# Patient Record
Sex: Male | Born: 1980
Health system: Southern US, Community
[De-identification: ages and names within clinical notes are randomized; demographics above are authoritative.]

## PROBLEM LIST (undated history)

## (undated) DIAGNOSIS — K4090 Unilateral inguinal hernia, without obstruction or gangrene, not specified as recurrent: Secondary | ICD-10-CM

## (undated) DIAGNOSIS — K219 Gastro-esophageal reflux disease without esophagitis: Secondary | ICD-10-CM

## (undated) DIAGNOSIS — N200 Calculus of kidney: Secondary | ICD-10-CM

## (undated) HISTORY — DX: Unilateral inguinal hernia, without obstruction or gangrene, not specified as recurrent: K40.90

## (undated) HISTORY — PX: CHOLECYSTECTOMY: SHX55

## (undated) SURGERY — REPAIR, HERNIA, INGUINAL, BILATERAL, LAPAROSCOPIC
Anesthesia: General | Laterality: Bilateral

## (undated) SURGERY — Surgical Case
Anesthesia: *Unknown

---

## 2003-12-17 ENCOUNTER — Emergency Department (HOSPITAL_COMMUNITY): Admission: EM | Admit: 2003-12-17 | Discharge: 2003-12-17 | Payer: Self-pay | Admitting: Emergency Medicine

## 2005-08-02 ENCOUNTER — Ambulatory Visit (HOSPITAL_COMMUNITY): Admission: RE | Admit: 2005-08-02 | Discharge: 2005-08-02 | Payer: Self-pay | Admitting: Cardiovascular Disease

## 2005-08-09 ENCOUNTER — Ambulatory Visit: Payer: Self-pay | Admitting: Internal Medicine

## 2006-11-25 ENCOUNTER — Emergency Department: Payer: Self-pay | Admitting: Emergency Medicine

## 2007-11-14 ENCOUNTER — Ambulatory Visit: Payer: Self-pay | Admitting: General Practice

## 2008-11-23 ENCOUNTER — Emergency Department: Payer: Self-pay | Admitting: Emergency Medicine

## 2008-11-24 ENCOUNTER — Emergency Department: Payer: Self-pay | Admitting: Emergency Medicine

## 2009-01-17 ENCOUNTER — Ambulatory Visit: Payer: Self-pay | Admitting: Internal Medicine

## 2009-02-03 ENCOUNTER — Ambulatory Visit: Payer: Self-pay | Admitting: Internal Medicine

## 2009-02-18 ENCOUNTER — Ambulatory Visit: Payer: Self-pay | Admitting: Surgery

## 2009-02-25 ENCOUNTER — Ambulatory Visit: Payer: Self-pay | Admitting: Surgery

## 2010-03-21 ENCOUNTER — Encounter: Admission: RE | Admit: 2010-03-21 | Discharge: 2010-03-21 | Payer: Self-pay | Admitting: Occupational Medicine

## 2011-05-03 ENCOUNTER — Emergency Department (HOSPITAL_COMMUNITY)
Admission: EM | Admit: 2011-05-03 | Discharge: 2011-05-03 | Disposition: A | Discharge status: Home / Self Care | Payer: Worker's Compensation | Attending: Emergency Medicine | Admitting: Emergency Medicine

## 2011-05-03 DIAGNOSIS — X58XXXA Exposure to other specified factors, initial encounter: Secondary | ICD-10-CM | POA: Insufficient documentation

## 2011-05-03 DIAGNOSIS — Y990 Civilian activity done for income or pay: Secondary | ICD-10-CM | POA: Insufficient documentation

## 2011-05-03 DIAGNOSIS — Z7721 Contact with and (suspected) exposure to potentially hazardous body fluids: Secondary | ICD-10-CM | POA: Insufficient documentation

## 2011-05-28 IMAGING — CR DG CHEST 1V
1 series · 1 of 1 positions shown · non-contrast
Comparison: None.

CLINICAL DATA: Physical exam

CHEST - 1 VIEW

[view not recorded]
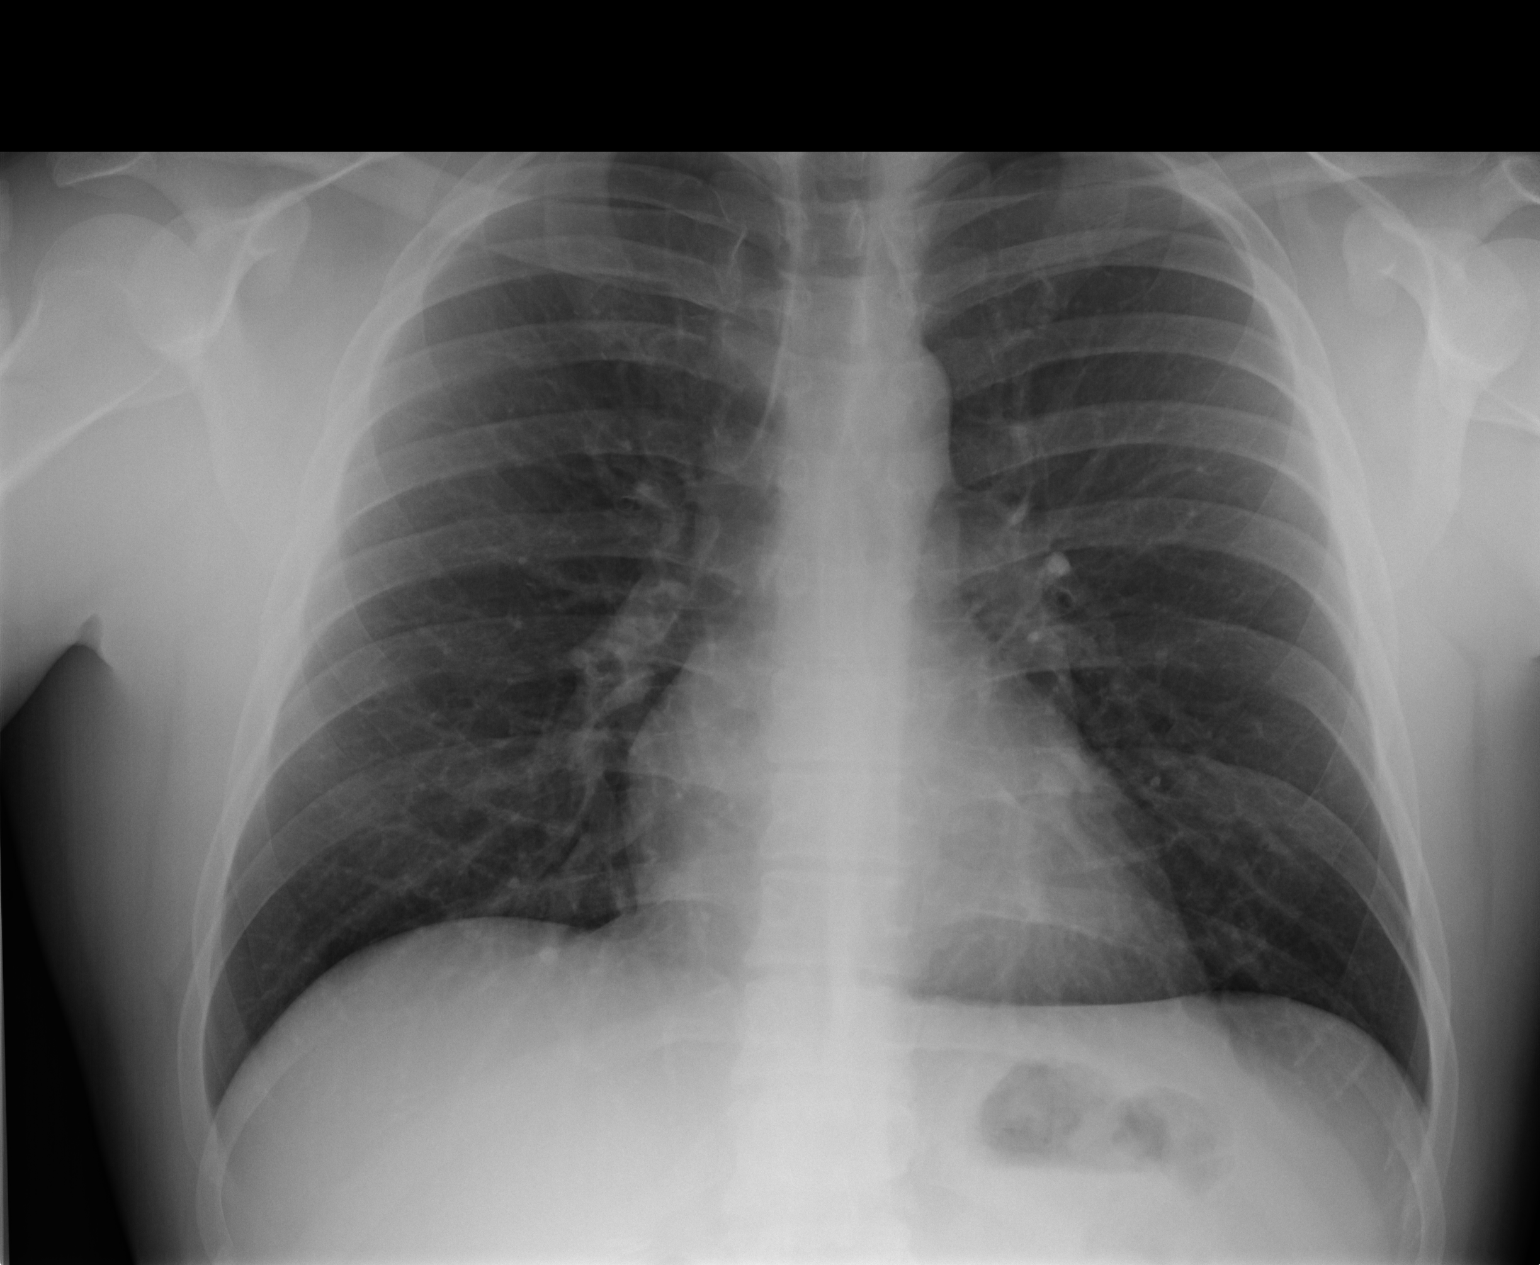

[1 of 1 positions shown; findings below may reference images not displayed]

FINDINGS: The lungs are clear.  Mediastinal contours appear normal.
The heart is within normal limits in size.  No bony abnormality is
seen.
IMPRESSION: No active lung disease.

## 2013-04-13 ENCOUNTER — Emergency Department (HOSPITAL_COMMUNITY)
Admission: EM | Admit: 2013-04-13 | Discharge: 2013-04-13 | Disposition: A | Discharge status: Home / Self Care | Payer: Worker's Compensation | Attending: Emergency Medicine | Admitting: Emergency Medicine

## 2013-04-13 ENCOUNTER — Encounter (HOSPITAL_COMMUNITY): Payer: Self-pay | Admitting: Emergency Medicine

## 2013-04-13 DIAGNOSIS — S50861A Insect bite (nonvenomous) of right forearm, initial encounter: Secondary | ICD-10-CM

## 2013-04-13 DIAGNOSIS — R51 Headache: Secondary | ICD-10-CM | POA: Insufficient documentation

## 2013-04-13 DIAGNOSIS — R209 Unspecified disturbances of skin sensation: Secondary | ICD-10-CM | POA: Insufficient documentation

## 2013-04-13 DIAGNOSIS — Y9289 Other specified places as the place of occurrence of the external cause: Secondary | ICD-10-CM | POA: Insufficient documentation

## 2013-04-13 DIAGNOSIS — Y9389 Activity, other specified: Secondary | ICD-10-CM | POA: Insufficient documentation

## 2013-04-13 DIAGNOSIS — Z23 Encounter for immunization: Secondary | ICD-10-CM | POA: Insufficient documentation

## 2013-04-13 DIAGNOSIS — R21 Rash and other nonspecific skin eruption: Secondary | ICD-10-CM | POA: Insufficient documentation

## 2013-04-13 DIAGNOSIS — IMO0001 Reserved for inherently not codable concepts without codable children: Secondary | ICD-10-CM | POA: Insufficient documentation

## 2013-04-13 MED ORDER — SULFAMETHOXAZOLE-TRIMETHOPRIM 800-160 MG PO TABS: 1.0000 | ORAL_TABLET | Freq: Two times a day (BID) | ORAL | Status: DC

## 2013-04-13 MED ORDER — TETANUS-DIPHTH-ACELL PERTUSSIS 5-2.5-18.5 LF-MCG/0.5 IM SUSP
0.5000 mL | Freq: Once | INTRAMUSCULAR | Status: AC
  Administered 2013-04-13: 0.5 mL via INTRAMUSCULAR
  Filled 2013-04-13: qty 0.5

## 2013-04-13 NOTE — ED Provider Notes (Signed)
History    This chart was scribed for Bob Liu, a non-physician practitioner working with No att. providers found by Lewanda Rife, ED Scribe. This patient was seen in room TR05C/TR05C and the patient's care was started at 2219.     CSN: [id number]  Arrival date & time 04/13/13  2050   First MD Initiated Contact with Patient 04/13/13 2132      Chief Complaint  Patient presents with  . Insect Bite    (Consider location/radiation/quality/duration/timing/severity/associated sxs/prior treatment) The history is provided by the patient.   HPI Comments: PASTOR SGRO is a 32 y.o. male who presents to the Emergency Department complaining of worsening unknown insect bite onset approximately 4 am this morning. Pt noticed increased erythema and swelling on the right lateral forearm from initial bite. States area is only painful w/ palpation and rate pain at worse 4/10. Pt also reports noticing an insect head at site of swelling. Pt states he was able to remove insect head, but unable to identify it. Pt has noticed minimal clear drainage from site. Has associated numbness in left pinky finger and some mild discomfort in left elbow. Patient is also complaining of slight generalized dull headache. Denies fevers, chills, nausea, vomiting, visual disturbance.    History reviewed. No pertinent past medical history.  History reviewed. No pertinent past surgical history.  No family history on file.  History  Substance Use Topics  . Smoking status: Never Smoker   . Smokeless tobacco: Not on file  . Alcohol Use: Yes      Review of Systems  Constitutional: Negative for fever and chills.  Gastrointestinal: Negative for nausea, vomiting and abdominal pain.  Musculoskeletal: Positive for arthralgias.  Skin: Positive for rash.  Neurological: Positive for numbness and headaches.  Psychiatric/Behavioral: Negative for confusion.    Allergies  Review of patient's allergies  indicates no known allergies.  Home Medications   Current Outpatient Rx  Name  Route  Sig  Dispense  Refill  . fexofenadine (ALLEGRA) 180 MG tablet   Oral   Take 180 mg by mouth daily.         Marland Kitchen sulfamethoxazole-trimethoprim (SEPTRA DS) 800-160 MG per tablet   Oral   Take 1 tablet by mouth every 12 (twelve) hours.   20 tablet   0     BP 144/86  Pulse 79  Temp(Src) 98.5 F (36.9 C) (Oral)  Resp 20  SpO2 97%  Physical Exam  Nursing note and vitals reviewed. Constitutional: He is oriented to person, place, and time. He appears well-developed and well-nourished. No distress.  HENT:  Head: Normocephalic and atraumatic.  Eyes: EOM are normal.  Neck: Neck supple. No tracheal deviation present.  Cardiovascular: Normal rate.   Pulmonary/Chest: Effort normal. No respiratory distress.  Musculoskeletal: Normal range of motion. He exhibits tenderness.  Neurological: He is alert and oriented to person, place, and time.  Skin: Skin is warm and dry. There is erythema.  5.5 cm in diameter lesion with central puncture wound and surrounding erythema, no fluctuance, no induration, no drainage, mildly tender on lateral right forearm.   Psychiatric: He has a normal mood and affect. His behavior is normal.    ED Course  Procedures (including critical care time) Medications  TDaP (BOOSTRIX) injection 0.5 mL (0.5 mLs Intramuscular Given 04/13/13 2247)    Labs Reviewed - No data to display No results found.   1. Insect bite of forearm with local reaction, right, initial encounter  MDM  Suspect uncomplicated cellulitis from unknown insect bite based on limited area of involvement, minimal pain, no systemic signs of illness (eg, fever, chills, dehydration, altered mental status, tachypnea, tachycardia, hypotension), no risk factors for serious illness (eg, extremes of age, general debility, immunocompromised status). PE reveals redness, swelling, mildly tender, warm to touch. Skin  intact, No bleeding. No bullae. Non purulent. Non circumferential.  Borders are not elevated or sharply demarcated. Drew a line around the area of infection. Pt was instructed to return to the ED if area surpasses the boarder or pain intensifies. Return precautions surrounding brown recluse and black widow bites also discussed. Will prescribed Bactrim to cover for MRSA, direct pt to apply warm compresses and to return to ED. Patient seems reliable for return with worsening symptoms and understands return precautions well.       I personally performed the services described in this documentation, which was scribed in my presence. The recorded information has been reviewed and is accurate.     Jeannetta Ellis, PA-C 04/14/13 0147

## 2013-04-13 NOTE — ED Notes (Signed)
Discharge instructions reviewed. Pt verbalized understanding.  

## 2013-04-13 NOTE — ED Notes (Signed)
Pt states he thinks he was bitten by a spider this morning. Pt took some anti-histamine and hydrocortisone cream and it helped for a while now pt has swelling, pain, his pinky is numb. 1/10 pain. Pt also states he has some oozing from the bite site.

## 2013-04-13 NOTE — ED Notes (Signed)
PT. REPORTS " SPIDER BITE " AT RIGHT FOREARM THIS MORNING WITH SWELLING / DRAINAGE , NO FEVER OR CHILLS, SLIGHT HEADACHE.

## 2013-04-16 NOTE — ED Provider Notes (Signed)
Medical screening examination/treatment/procedure(s) were performed by non-physician practitioner and as supervising physician I was immediately available for consultation/collaboration.   Pippa Hanif, MD 04/16/13 0709 

## 2014-06-20 ENCOUNTER — Emergency Department: Payer: Self-pay | Admitting: Emergency Medicine

## 2014-06-20 LAB — URINALYSIS, COMPLETE
Bacteria: NONE SEEN
Bilirubin,UR: NEGATIVE
Blood: NEGATIVE
Glucose,UR: NEGATIVE mg/dL (ref 0–75)
Ketone: NEGATIVE
Leukocyte Esterase: NEGATIVE
Nitrite: NEGATIVE
Ph: 7 (ref 4.5–8.0)
Protein: NEGATIVE
RBC,UR: <1 /HPF (ref 0–5)
Specific Gravity: 1.005 (ref 1.003–1.030)
Squamous Epithelial: NONE SEEN
WBC UR: NONE SEEN /HPF (ref 0–5)

## 2015-06-24 ENCOUNTER — Telehealth: Payer: Self-pay | Admitting: Surgery

## 2015-06-24 NOTE — Telephone Encounter (Signed)
Left voice message for pt to call and make appointment for inguinal hernia

## 2015-06-27 ENCOUNTER — Emergency Department: Payer: Commercial Managed Care - HMO

## 2015-06-27 ENCOUNTER — Emergency Department
Admission: EM | Admit: 2015-06-27 | Discharge: 2015-06-27 | Disposition: A | Discharge status: Home / Self Care | Payer: Commercial Managed Care - HMO | Attending: Emergency Medicine | Admitting: Emergency Medicine

## 2015-06-27 ENCOUNTER — Encounter: Payer: Self-pay | Admitting: Emergency Medicine

## 2015-06-27 DIAGNOSIS — Z79899 Other long term (current) drug therapy: Secondary | ICD-10-CM | POA: Diagnosis not present

## 2015-06-27 DIAGNOSIS — R109 Unspecified abdominal pain: Secondary | ICD-10-CM | POA: Diagnosis present

## 2015-06-27 DIAGNOSIS — N200 Calculus of kidney: Secondary | ICD-10-CM

## 2015-06-27 HISTORY — DX: Calculus of kidney: N20.0

## 2015-06-27 LAB — URINALYSIS COMPLETE WITH MICROSCOPIC (ARMC ONLY)
Bacteria, UA: NONE SEEN
Bilirubin Urine: NEGATIVE
Glucose, UA: NEGATIVE mg/dL
Ketones, ur: NEGATIVE mg/dL
Leukocytes, UA: NEGATIVE
Nitrite: NEGATIVE
PH: 6 (ref 5.0–8.0)
Protein, ur: 30 mg/dL — AB
Specific Gravity, Urine: 1.031 — ABNORMAL HIGH (ref 1.005–1.030)
Squamous Epithelial / HPF: NONE SEEN

## 2015-06-27 LAB — CBC
HCT: 46 % (ref 40.0–52.0)
Hemoglobin: 15.7 g/dL (ref 13.0–18.0)
MCH: 30 pg (ref 26.0–34.0)
MCHC: 34 g/dL (ref 32.0–36.0)
MCV: 88 fL (ref 80.0–100.0)
Platelets: 258 10*3/uL (ref 150–440)
RBC: 5.23 MIL/uL (ref 4.40–5.90)
RDW: 13.4 % (ref 11.5–14.5)
WBC: 7.8 10*3/uL (ref 3.8–10.6)

## 2015-06-27 LAB — BASIC METABOLIC PANEL
Anion gap: 9 (ref 5–15)
BUN: 16 mg/dL (ref 6–20)
CO2: 25 mmol/L (ref 22–32)
Calcium: 10 mg/dL (ref 8.9–10.3)
Chloride: 108 mmol/L (ref 101–111)
Creatinine, Ser: 1.23 mg/dL (ref 0.61–1.24)
GFR calc Af Amer: >60 mL/min (ref >60)
GFR calc non Af Amer: >60 mL/min (ref >60)
Glucose, Bld: 100 mg/dL — ABNORMAL HIGH (ref 65–99)
Potassium: 4 mmol/L (ref 3.5–5.1)
SODIUM: 142 mmol/L (ref 135–145)

## 2015-06-27 MED ORDER — ONDANSETRON HCL 4 MG/2ML IJ SOLN
4.0000 mg | Freq: Once | INTRAMUSCULAR | Status: AC
  Administered 2015-06-27: 4 mg via INTRAVENOUS

## 2015-06-27 MED ORDER — ONDANSETRON HCL 4 MG PO TABS: 4.0000 mg | ORAL_TABLET | Freq: Three times a day (TID) | ORAL | Status: DC | PRN

## 2015-06-27 MED ORDER — TAMSULOSIN HCL 0.4 MG PO CAPS
ORAL_CAPSULE | ORAL | Status: AC
  Administered 2015-06-27: 0.4 mg via ORAL
  Filled 2015-06-27: qty 1

## 2015-06-27 MED ORDER — HYDROMORPHONE HCL 1 MG/ML IJ SOLN
INTRAMUSCULAR | Status: AC
  Filled 2015-06-27: qty 1

## 2015-06-27 MED ORDER — FENTANYL CITRATE (PF) 100 MCG/2ML IJ SOLN
INTRAMUSCULAR | Status: AC
  Filled 2015-06-27: qty 2

## 2015-06-27 MED ORDER — HYDROMORPHONE HCL 1 MG/ML IJ SOLN
INTRAMUSCULAR | Status: AC
  Administered 2015-06-27: 1 mg via INTRAVENOUS
  Filled 2015-06-27: qty 1

## 2015-06-27 MED ORDER — MORPHINE SULFATE (PF) 4 MG/ML IV SOLN
INTRAVENOUS | Status: AC
  Administered 2015-06-27: 4 mg via INTRAVENOUS
  Filled 2015-06-27: qty 1

## 2015-06-27 MED ORDER — KETOROLAC TROMETHAMINE 30 MG/ML IJ SOLN
INTRAMUSCULAR | Status: AC
  Filled 2015-06-27: qty 1

## 2015-06-27 MED ORDER — SODIUM CHLORIDE 0.9 % IV BOLUS (SEPSIS)
1000.0000 mL | Freq: Once | INTRAVENOUS | Status: AC
  Administered 2015-06-27: 1000 mL via INTRAVENOUS

## 2015-06-27 MED ORDER — ONDANSETRON HCL 4 MG/2ML IJ SOLN
INTRAMUSCULAR | Status: AC
  Filled 2015-06-27: qty 2

## 2015-06-27 MED ORDER — ACETAMINOPHEN 325 MG PO TABS
ORAL_TABLET | ORAL | Status: AC
  Filled 2015-06-27: qty 2

## 2015-06-27 MED ORDER — HYDROMORPHONE HCL 1 MG/ML IJ SOLN
1.0000 mg | Freq: Once | INTRAMUSCULAR | Status: AC
  Administered 2015-06-27: 1 mg via INTRAVENOUS

## 2015-06-27 MED ORDER — ACETAMINOPHEN 325 MG PO TABS
650.0000 mg | ORAL_TABLET | Freq: Once | ORAL | Status: AC
  Administered 2015-06-27: 650 mg via ORAL

## 2015-06-27 MED ORDER — KETOROLAC TROMETHAMINE 30 MG/ML IJ SOLN
30.0000 mg | Freq: Once | INTRAMUSCULAR | Status: AC
  Administered 2015-06-27: 30 mg via INTRAVENOUS

## 2015-06-27 MED ORDER — OXYCODONE-ACETAMINOPHEN 5-325 MG PO TABS: 1.0000 | ORAL_TABLET | ORAL | Status: DC | PRN

## 2015-06-27 MED ORDER — MORPHINE SULFATE (PF) 4 MG/ML IV SOLN
4.0000 mg | Freq: Once | INTRAVENOUS | Status: AC
  Administered 2015-06-27: 4 mg via INTRAVENOUS

## 2015-06-27 MED ORDER — TAMSULOSIN HCL 0.4 MG PO CAPS: 0.4000 mg | ORAL_CAPSULE | Freq: Every day | ORAL | Status: DC

## 2015-06-27 MED ORDER — TAMSULOSIN HCL 0.4 MG PO CAPS
ORAL_CAPSULE | ORAL | Status: AC
  Filled 2015-06-27: qty 1

## 2015-06-27 MED ORDER — FENTANYL CITRATE (PF) 100 MCG/2ML IJ SOLN
50.0000 ug | Freq: Once | INTRAMUSCULAR | Status: AC
  Administered 2015-06-27: 50 ug via INTRAVENOUS

## 2015-06-27 MED ORDER — TAMSULOSIN HCL 0.4 MG PO CAPS
0.4000 mg | ORAL_CAPSULE | Freq: Once | ORAL | Status: AC
  Administered 2015-06-27: 0.4 mg via ORAL

## 2015-06-27 NOTE — ED Notes (Signed)
C/o left flank pain x 1 week, denies any n,v, having difficulty urinating, hx of kidney stones

## 2015-06-27 NOTE — ED Provider Notes (Signed)
[facility] Emergency Department Provider Note   ____________________________________________  Time seen: 40  I have reviewed the triage vital signs and the nursing notes.   HISTORY  Chief Complaint Flank Pain   History limited by: Not Limited   HPI Bob Liu is a 34 y.o. male who presents to the emergency department today because of concerns for left flank pain. Patient states that the pain started becoming very intense roughly 2 hours ago today. He states it is a 9 out of 10 at its worse. He has had some accompanying nausea. The pain does remind him of previous kidney stones. His last kidney stone was roughly 6 years ago. He did see his primary care doctor couple of days ago for much milder flank pain. At that time a UA was done which did show trace blood. He denies ever requiring a lithotripsy.    Past Medical History  Diagnosis Date  . Kidney stones     There are no active problems to display for this patient.   History reviewed. No pertinent past surgical history.  Current Outpatient Rx  Name  Route  Sig  Dispense  Refill  . fexofenadine (ALLEGRA) 180 MG tablet   Oral   Take 180 mg by mouth daily.         Marland Kitchen sulfamethoxazole-trimethoprim (SEPTRA DS) 800-160 MG per tablet   Oral   Take 1 tablet by mouth every 12 (twelve) hours.   20 tablet   0     Allergies Review of patient's allergies indicates no known allergies.  No family history on file.  Social History Social History  Substance Use Topics  . Smoking status: Never Smoker   . Smokeless tobacco: None  . Alcohol Use: Yes    Review of Systems  Constitutional: Negative for fever. Cardiovascular: Negative for chest pain. Respiratory: Negative for shortness of breath. Gastrointestinal: Left flank pain Genitourinary: Negative for dysuria. Musculoskeletal: Negative for back pain. Skin: Negative for rash. Neurological: Negative for headaches, focal weakness or  numbness.  10-point ROS otherwise negative.  ____________________________________________   PHYSICAL EXAM:  VITAL SIGNS: ED Triage Vitals  Enc Vitals Group     BP 06/27/15 1743 120/72 mmHg     Pulse Rate 06/27/15 1743 91     Resp 06/27/15 1743 20     Temp 06/27/15 1743 98.4 F (36.9 C)     Temp Source 06/27/15 1743 Oral     SpO2 06/27/15 1743 100 %     Weight 06/27/15 1743 110 lb (49.896 kg)     Height 06/27/15 1743 4\' 11"  (1.499 m)     Head Cir --      Peak Flow --      Pain Score 06/27/15 1744 9   Constitutional: Alert and oriented. Moderate distress. Pacing around the room. Eyes: Conjunctivae are normal. PERRL. Normal extraocular movements. ENT   Head: Normocephalic and atraumatic.   Nose: No congestion/rhinnorhea.   Mouth/Throat: Mucous membranes are moist.   Neck: No stridor. Hematological/Lymphatic/Immunilogical: No cervical lymphadenopathy. Cardiovascular: Normal rate, regular rhythm.  No murmurs, rubs, or gallops. Respiratory: Normal respiratory effort without tachypnea nor retractions. Breath sounds are clear and equal bilaterally. No wheezes/rales/rhonchi. Gastrointestinal: Soft and nontender.  Genitourinary: Deferred Musculoskeletal: Normal range of motion in all extremities. No joint effusions.  No lower extremity tenderness nor edema. Neurologic:  Normal speech and language. No gross focal neurologic deficits are appreciated. Speech is normal.  Skin:  Skin is warm, dry and intact. No rash  noted. Psychiatric: Mood and affect are normal. Speech and behavior are normal. Patient exhibits appropriate insight and judgment.  ____________________________________________    LABS (pertinent positives/negatives)  Labs Reviewed  URINALYSIS COMPLETEWITH MICROSCOPIC ([facility] ONLY) - Abnormal; Notable for the following:    Color, Urine YELLOW (*)    APPearance HAZY (*)    Specific Gravity, Urine 1.031 (*)    Hgb urine dipstick 3+ (*)    Protein, ur 30 (*)     All other components within normal limits  BASIC METABOLIC PANEL - Abnormal; Notable for the following:    Glucose, Bld 100 (*)    All other components within normal limits  CBC     ____________________________________________   EKG  None  ____________________________________________    RADIOLOGY  US Renal IMPRESSION: Mild left-sided hydronephrosis. No obvious renal calculi. ____________________________________________   PROCEDURES  Procedure(s) performed: None  Critical Care performed: No  ____________________________________________   INITIAL IMPRESSION / ASSESSMENT AND PLAN / ED COURSE  Pertinent labs & imaging results that were available during my care of the patient were reviewed by me and considered in my medical decision making (see chart for details).  Patient presents to the emergency department today with concerns for left-sided flank pain. Patient does have a history of kidney stones. On exam patient up pacing around the room. Urine is consistent with a kidney stone. Ultrasound shows just some mild hydronephrosis. Patient's pain was well controlled with medications. Will discharge home with Flomax, pain medication and antiemetics.  ____________________________________________   FINAL CLINICAL IMPRESSION(S) / ED DIAGNOSES  Final diagnoses:  Kidney stone     Phineas Semen, MD 06/27/15 2234

## 2015-06-27 NOTE — Discharge Instructions (Signed)
Please seek medical attention for any high fevers, chest pain, shortness of breath, change in behavior, persistent vomiting, bloody stool or any other new or concerning symptoms. ° ° °Kidney Stones °Kidney stones (urolithiasis) are deposits that form inside your kidneys. The intense pain is caused by the stone moving through the urinary tract. When the stone moves, the ureter goes into spasm around the stone. The stone is usually passed in the urine.  °CAUSES  °· A disorder that makes certain neck glands produce too much parathyroid hormone (primary hyperparathyroidism). °· A buildup of uric acid crystals, similar to gout in your joints. °· Narrowing (stricture) of the ureter. °· A kidney obstruction present at birth (congenital obstruction). °· Previous surgery on the kidney or ureters. °· Numerous kidney infections. °SYMPTOMS  °· Feeling sick to your stomach (nauseous). °· Throwing up (vomiting). °· Blood in the urine (hematuria). °· Pain that usually spreads (radiates) to the groin. °· Frequency or urgency of urination. °DIAGNOSIS  °· Taking a history and physical exam. °· Blood or urine tests. °· CT scan. °· Occasionally, an examination of the inside of the urinary bladder (cystoscopy) is performed. °TREATMENT  °· Observation. °· Increasing your fluid intake. °· Extracorporeal shock wave lithotripsy--This is a noninvasive procedure that uses shock waves to break up kidney stones. °· Surgery may be needed if you have severe pain or persistent obstruction. There are various surgical procedures. Most of the procedures are performed with the use of small instruments. Only small incisions are needed to accommodate these instruments, so recovery time is minimized. °The size, location, and chemical composition are all important variables that will determine the proper choice of action for you. Talk to your health care provider to better understand your situation so that you will minimize the risk of injury to yourself  and your kidney.  °HOME CARE INSTRUCTIONS  °· Drink enough water and fluids to keep your urine clear or pale yellow. This will help you to pass the stone or stone fragments. °· Strain all urine through the provided strainer. Keep all particulate matter and stones for your health care provider to see. The stone causing the pain may be as small as a grain of salt. It is very important to use the strainer each and every time you pass your urine. The collection of your stone will allow your health care provider to analyze it and verify that a stone has actually passed. The stone analysis will often identify what you can do to reduce the incidence of recurrences. °· Only take over-the-counter or prescription medicines for pain, discomfort, or fever as directed by your health care provider. °· Make a follow-up appointment with your health care provider as directed. °· Get follow-up X-rays if required. The absence of pain does not always mean that the stone has passed. It may have only stopped moving. If the urine remains completely obstructed, it can cause loss of kidney function or even complete destruction of the kidney. It is your responsibility to make sure X-rays and follow-ups are completed. Ultrasounds of the kidney can show blockages and the status of the kidney. Ultrasounds are not associated with any radiation and can be performed easily in a matter of minutes. °SEEK MEDICAL CARE IF: °· You experience pain that is progressive and unresponsive to any pain medicine you have been prescribed. °SEEK IMMEDIATE MEDICAL CARE IF:  °· Pain cannot be controlled with the prescribed medicine. °· You have a fever or shaking chills. °· The severity or intensity   of pain increases over 18 hours and is not relieved by pain medicine. °· You develop a new onset of abdominal pain. °· You feel faint or pass out. °· You are unable to urinate. °MAKE SURE YOU:  °· Understand these instructions. °· Will watch your condition. °· Will get  help right away if you are not doing well or get worse. °Document Released: 10/15/2005 Document Revised: 06/17/2013 Document Reviewed: 03/18/2013 °Exit[employer]® Patient Information ©2015 [employer]. This information is not intended to replace advice given to you by your health care provider. Make sure you discuss any questions you have with your health care provider. ° °

## 2015-06-29 ENCOUNTER — Ambulatory Visit: Payer: Commercial Managed Care - HMO | Admitting: Surgery

## 2015-06-29 DIAGNOSIS — K4090 Unilateral inguinal hernia, without obstruction or gangrene, not specified as recurrent: Secondary | ICD-10-CM

## 2015-06-29 HISTORY — DX: Unilateral inguinal hernia, without obstruction or gangrene, not specified as recurrent: K40.90

## 2015-06-30 ENCOUNTER — Encounter: Payer: Self-pay | Admitting: Surgery

## 2015-06-30 ENCOUNTER — Ambulatory Visit (INDEPENDENT_AMBULATORY_CARE_PROVIDER_SITE_OTHER): Payer: Commercial Managed Care - HMO | Admitting: Surgery

## 2015-06-30 VITALS — BP 142/74 | HR 82 | Temp 97.3°F | Ht 72.0 in | Wt 208.0 lb

## 2015-06-30 DIAGNOSIS — K4020 Bilateral inguinal hernia, without obstruction or gangrene, not specified as recurrent: Secondary | ICD-10-CM

## 2015-06-30 NOTE — Progress Notes (Signed)
  Surgical Consultation  06/30/2015  Bob Liu is an 34 y.o. male.   CC: right groin pain  HPI: this patient who is experienced right groin pain for 2 months he's been treated with multiple medications and has not improved he has not noticed a bulge but did notice this occurring right after doing some heavy lifting at the gym. He denies nausea vomiting fevers or chills and no obstructive symptoms. His bowel movements are normal. He has no symptoms on the left.  Past Medical History  Diagnosis Date  . Kidney stones   . Inguinal hernia 06/29/2015    Past Surgical History  Procedure Laterality Date  . No past surgeries      Family History  Problem Relation Age of Onset  . Inguinal hernia Neg Hx   . Macular degeneration Mother   . Diabetes Father   . Hypertension Father     Social History:  reports that he has never smoked. He does not have any smokeless tobacco history on file. He reports that he drinks alcohol. He reports that he does not use illicit drugs.  Allergies: No Known Allergies  Medications reviewed.   Review of Systems:   Review of Systems  Constitutional: Negative.   HENT: Negative.   Eyes: Negative.   Respiratory: Negative.   Cardiovascular: Negative.   Gastrointestinal: Negative.   Genitourinary: Negative.   Musculoskeletal: Negative.   Skin: Negative.   Neurological: Negative.   Endo/Heme/Allergies: Negative.   Psychiatric/Behavioral: Negative.      Physical Exam:  BP 142/74 mmHg  Pulse 82  Temp(Src) 97.3 F (36.3 C) (Oral)  Ht 6' (1.829 m)  Wt 208 lb (94.348 kg)  BMI 28.20 kg/m2  Physical Exam  Constitutional: He is oriented to person, place, and time and well-developed, well-nourished, and in no distress.  HENT:  Head: Normocephalic and atraumatic.  Eyes: No scleral icterus.  Neck: Normal range of motion.  Cardiovascular: Normal rate, regular rhythm and normal heart sounds.   Pulmonary/Chest: Effort normal and breath sounds  normal. No respiratory distress. He has no wheezes. He has no rales.  Abdominal: Soft. He exhibits no distension. There is no tenderness. There is no rebound.  Genitourinary: Penis normal.  Normal testicles small right-sided hernia and smaller left-sided hernia both reducible and nontender  Musculoskeletal: He exhibits no edema.  Lymphadenopathy:    He has no cervical adenopathy.  Neurological: He is alert and oriented to person, place, and time.  Skin: Skin is warm and dry.  Psychiatric: Mood, affect and judgment normal.      No results found for this or any previous visit (from the past 48 hour(s)). No results found.  Assessment/Plan:  Patient with right groin pain is been going on for 2 months. On exam he has bilateral inguinal hernias. I recommended laparoscopic preperitoneal repair of bilateral inguinal hernias for control his of his right side symptoms and decrease of his risk for problems on the left. Options of observation reviewed the risk bleeding infection open procedure recurrence and ischemic orchitis were discussed he understood and agreed to proceed  Lattie Haw, MD, FACS

## 2015-06-30 NOTE — Patient Instructions (Signed)
You are requesting to have your Hernias repaired. We will arrange this on October 5th.  Please see your (BLUE) pre-care surgery form for more information.  If you have FMLA or Disability forms that need filled out for your job, please bring those in ahead of your scheduled surgery. There is a $25.00 fee for this to be completed. We will get forms completed the day after surgery.

## 2015-07-12 NOTE — Telephone Encounter (Signed)
Pt advised of pre op date/time and sx date. Sx: 08/03/15 with Dr Perlie Mayo bilateral inguinal hernia repair. Pre op: 07/29/15 between 1-5-phone.

## 2015-07-18 ENCOUNTER — Telehealth: Payer: Self-pay | Admitting: Surgery

## 2015-07-18 ENCOUNTER — Encounter: Payer: Self-pay | Admitting: *Deleted

## 2015-07-18 NOTE — Telephone Encounter (Signed)
Patient has surgery scheduled for inguinal hernia with Excell Seltzer on October 5th. The hernia is causing him more pain and becoming more uncomfortable. Patient is a Company secretary. He wants to know if he is okay to work until his surgery or should he be out of work until he has surgery. He is okay with working until surgery, but wants to make sure he isn't making the hernia worse. The hernia is starting to cause more problems and becoming more uncomfortable for him at work, with lifting, etc. Please call and advise. (also, Angie says we can move the surgery up to closer date if that's an option for him)

## 2015-07-18 NOTE — Telephone Encounter (Signed)
Returned patient call. Patient stated that his hernia was becoming more painful but was still reducible. 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 A note was written for the patient to give to his employer stating that he should be placed on light duty. Restrictions include no lifting greater than 15 lb until the date of his surgery on 08/03/15. Patient came by the [facility] office and picked up the letter.

## 2015-07-19 ENCOUNTER — Other Ambulatory Visit: Payer: Self-pay

## 2015-07-29 ENCOUNTER — Other Ambulatory Visit: Payer: Self-pay

## 2015-07-29 ENCOUNTER — Encounter: Payer: Self-pay | Admitting: *Deleted

## 2015-07-29 NOTE — Patient Instructions (Signed)
  Your procedure is scheduled on: 08-03-15 Report to [facility] 2ND FLOOR To find out your arrival time please call [phone] between 1PM - 3PM on 08-02-15 (TUESDAY)  Remember: Instructions that are not followed completely may result in serious medical risk, up to and including death, or upon the discretion of your surgeon and anesthesiologist your surgery may need to be rescheduled.    _X___ 1. Do not eat food or drink liquids after midnight. No gum chewing or hard candies.     _X___ 2. No Alcohol for 24 hours before or after surgery.   ____ 3. Bring all medications with you on the day of surgery if instructed.    _X___ 4. Notify your doctor if there is any change in your medical condition     (cold, fever, infections).     Do not wear jewelry, make-up, hairpins, clips or nail polish.  Do not wear lotions, powders, or perfumes. You may wear deodorant.  Do not shave 48 hours prior to surgery. Men may shave face and neck.  Do not bring valuables to the hospital.    [facility] is not responsible for any belongings or valuables.               Contacts, dentures or bridgework may not be worn into surgery.  Leave your suitcase in the car. After surgery it may be brought to your room.  For patients admitted to the hospital, discharge time is determined by your treatment team.   Patients discharged the day of surgery will not be allowed to drive home.   Please read over the following fact sheets that you were given:      ____ Take these medicines the morning of surgery with A SIP OF WATER:    1. NONE  2.   3.   4.  5.  6.  ____ Fleet Enema (as directed)   _X___ Use CHG Soap as directed  ____ Use inhalers on the day of surgery  ____ Stop metformin 2 days prior to surgery    ____ Take 1/2 of usual insulin dose the night before surgery and none on the morning of surgery.   ____ Stop Coumadin/Plavix/aspirin-N/A  ____ Stop Anti-inflammatories-NO NSAIDS OR  ASA PRODUCTS-TYLENOL OK   ____ Stop supplements until after surgery.    ____ Bring C-Pap to the hospital.

## 2015-08-01 ENCOUNTER — Encounter
Admission: RE | Admit: 2015-08-01 | Discharge: 2015-08-01 | Disposition: A | Discharge status: Home / Self Care | Payer: Commercial Managed Care - HMO | Source: Ambulatory Visit | Attending: Surgery | Admitting: Surgery

## 2015-08-01 DIAGNOSIS — K4020 Bilateral inguinal hernia, without obstruction or gangrene, not specified as recurrent: Secondary | ICD-10-CM | POA: Diagnosis not present

## 2015-08-01 DIAGNOSIS — K219 Gastro-esophageal reflux disease without esophagitis: Secondary | ICD-10-CM | POA: Diagnosis not present

## 2015-08-01 LAB — CBC WITH DIFFERENTIAL/PLATELET
BASOS ABS: 0 10*3/uL (ref 0–0.1)
Basophils Relative: 1 %
EOS ABS: 0.2 10*3/uL (ref 0–0.7)
EOS PCT: 4 %
HCT: 42.6 % (ref 40.0–52.0)
Hemoglobin: 14.5 g/dL (ref 13.0–18.0)
LYMPHS PCT: 43 %
Lymphs Abs: 2.2 10*3/uL (ref 1.0–3.6)
MCH: 30.1 pg (ref 26.0–34.0)
MCHC: 34.1 g/dL (ref 32.0–36.0)
MCV: 88.3 fL (ref 80.0–100.0)
MONO ABS: 0.5 10*3/uL (ref 0.2–1.0)
Monocytes Relative: 11 %
Neutro Abs: 2.1 10*3/uL (ref 1.4–6.5)
Neutrophils Relative %: 41 %
PLATELETS: 182 10*3/uL (ref 150–440)
RBC: 4.83 MIL/uL (ref 4.40–5.90)
RDW: 12.8 % (ref 11.5–14.5)
WBC: 5 10*3/uL (ref 3.8–10.6)

## 2015-08-01 LAB — BASIC METABOLIC PANEL
Anion gap: 7 (ref 5–15)
BUN: 15 mg/dL (ref 6–20)
CALCIUM: 9.7 mg/dL (ref 8.9–10.3)
CO2: 28 mmol/L (ref 22–32)
CREATININE: 1.05 mg/dL (ref 0.61–1.24)
Chloride: 103 mmol/L (ref 101–111)
GFR calc Af Amer: >60 mL/min (ref >60)
GLUCOSE: 97 mg/dL (ref 65–99)
Potassium: 3.9 mmol/L (ref 3.5–5.1)
SODIUM: 138 mmol/L (ref 135–145)

## 2015-08-03 ENCOUNTER — Encounter
Admission: RE | Disposition: A | Discharge status: Home / Self Care | Payer: Self-pay | Source: Ambulatory Visit | Attending: Surgery

## 2015-08-03 ENCOUNTER — Ambulatory Visit: Payer: Commercial Managed Care - HMO | Admitting: Anesthesiology

## 2015-08-03 ENCOUNTER — Encounter: Payer: Self-pay | Admitting: Anesthesiology

## 2015-08-03 ENCOUNTER — Ambulatory Visit
Admission: RE | Admit: 2015-08-03 | Discharge: 2015-08-03 | Disposition: A | Discharge status: Home / Self Care | Payer: Commercial Managed Care - HMO | Source: Ambulatory Visit | Attending: Surgery | Admitting: Surgery

## 2015-08-03 DIAGNOSIS — K4020 Bilateral inguinal hernia, without obstruction or gangrene, not specified as recurrent: Secondary | ICD-10-CM

## 2015-08-03 DIAGNOSIS — K219 Gastro-esophageal reflux disease without esophagitis: Secondary | ICD-10-CM | POA: Insufficient documentation

## 2015-08-03 HISTORY — DX: Gastro-esophageal reflux disease without esophagitis: K21.9

## 2015-08-03 HISTORY — PX: INGUINAL HERNIA REPAIR: SHX194

## 2015-08-03 SURGERY — REPAIR, HERNIA, INGUINAL, LAPAROSCOPIC
Anesthesia: General | Laterality: Bilateral

## 2015-08-03 MED ORDER — HYDROCODONE-ACETAMINOPHEN 5-300 MG PO TABS: 1.0000 | ORAL_TABLET | ORAL | Status: DC | PRN

## 2015-08-03 MED ORDER — LACTATED RINGERS IV SOLN
INTRAVENOUS | Status: DC
  Administered 2015-08-03: 11:00:00 via INTRAVENOUS

## 2015-08-03 MED ORDER — LIDOCAINE HCL (CARDIAC) 20 MG/ML IV SOLN
INTRAVENOUS | Status: DC | PRN
  Administered 2015-08-03: 60 mg via INTRAVENOUS

## 2015-08-03 MED ORDER — ACETAMINOPHEN 10 MG/ML IV SOLN
INTRAVENOUS | Status: DC | PRN
  Administered 2015-08-03: 1000 mg via INTRAVENOUS

## 2015-08-03 MED ORDER — HYDROMORPHONE HCL 1 MG/ML IJ SOLN
INTRAMUSCULAR | Status: DC | PRN
  Administered 2015-08-03 (×2): 0.5 mg via INTRAVENOUS

## 2015-08-03 MED ORDER — MIDAZOLAM HCL 2 MG/2ML IJ SOLN
INTRAMUSCULAR | Status: DC | PRN
  Administered 2015-08-03: 2 mg via INTRAVENOUS

## 2015-08-03 MED ORDER — SODIUM CHLORIDE 0.9 % IR SOLN
Status: DC | PRN
  Administered 2015-08-03: 1 mL

## 2015-08-03 MED ORDER — FENTANYL CITRATE (PF) 100 MCG/2ML IJ SOLN
25.0000 ug | INTRAMUSCULAR | Status: DC | PRN
  Administered 2015-08-03 (×4): 25 ug via INTRAVENOUS

## 2015-08-03 MED ORDER — BUPIVACAINE-EPINEPHRINE (PF) 0.25% -1:200000 IJ SOLN
INTRAMUSCULAR | Status: DC | PRN
  Administered 2015-08-03: 30 mL

## 2015-08-03 MED ORDER — KETOROLAC TROMETHAMINE 30 MG/ML IJ SOLN
INTRAMUSCULAR | Status: DC | PRN
  Administered 2015-08-03: 30 mg via INTRAVENOUS

## 2015-08-03 MED ORDER — CEFAZOLIN SODIUM-DEXTROSE 2-3 GM-% IV SOLR: 2.0000 g | INTRAVENOUS | Status: DC

## 2015-08-03 MED ORDER — ONDANSETRON HCL 4 MG/2ML IJ SOLN
INTRAMUSCULAR | Status: DC | PRN
  Administered 2015-08-03: 4 mg via INTRAVENOUS

## 2015-08-03 MED ORDER — GLYCOPYRROLATE 0.2 MG/ML IJ SOLN
INTRAMUSCULAR | Status: DC | PRN
  Administered 2015-08-03: 0.6 mg via INTRAVENOUS

## 2015-08-03 MED ORDER — PROPOFOL 10 MG/ML IV BOLUS
INTRAVENOUS | Status: DC | PRN
  Administered 2015-08-03: 200 mg via INTRAVENOUS

## 2015-08-03 MED ORDER — CEFAZOLIN SODIUM-DEXTROSE 2-3 GM-% IV SOLR
INTRAVENOUS | Status: AC
  Filled 2015-08-03: qty 50

## 2015-08-03 MED ORDER — CHLORHEXIDINE GLUCONATE 4 % EX LIQD: 1.0000 "application " | Freq: Once | CUTANEOUS | Status: DC

## 2015-08-03 MED ORDER — ROCURONIUM BROMIDE 100 MG/10ML IV SOLN
INTRAVENOUS | Status: DC | PRN
  Administered 2015-08-03: 40 mg via INTRAVENOUS

## 2015-08-03 MED ORDER — NEOSTIGMINE METHYLSULFATE 10 MG/10ML IV SOLN
INTRAVENOUS | Status: DC | PRN
  Administered 2015-08-03: 3 mg via INTRAVENOUS

## 2015-08-03 MED ORDER — FENTANYL CITRATE (PF) 100 MCG/2ML IJ SOLN
INTRAMUSCULAR | Status: DC | PRN
  Administered 2015-08-03 (×3): 50 ug via INTRAVENOUS
  Administered 2015-08-03 (×2): 25 ug via INTRAVENOUS

## 2015-08-03 MED ORDER — ONDANSETRON HCL 4 MG/2ML IJ SOLN: 4.0000 mg | Freq: Once | INTRAMUSCULAR | Status: DC | PRN

## 2015-08-03 MED ORDER — DEXAMETHASONE SODIUM PHOSPHATE 4 MG/ML IJ SOLN
INTRAMUSCULAR | Status: DC | PRN
  Administered 2015-08-03: 5 mg via INTRAVENOUS

## 2015-08-03 MED ORDER — FAMOTIDINE 20 MG PO TABS
ORAL_TABLET | ORAL | Status: AC
  Administered 2015-08-03: 20 mg via ORAL
  Filled 2015-08-03: qty 1

## 2015-08-03 MED ORDER — FAMOTIDINE 20 MG PO TABS
20.0000 mg | ORAL_TABLET | Freq: Once | ORAL | Status: AC
  Administered 2015-08-03: 20 mg via ORAL

## 2015-08-03 SURGICAL SUPPLY — 40 items
ADHESIVE MASTISOL STRL (MISCELLANEOUS) ×3 IMPLANT
BLADE SURG SZ11 CARB STEEL (BLADE) ×3 IMPLANT
CANNULA DILATOR 12 W/SLV (CANNULA) IMPLANT
CANNULA DILATOR 12MM W/SLV (CANNULA)
CATH TRAY 16F METER LATEX (MISCELLANEOUS) ×3 IMPLANT
CHLORAPREP W/TINT 26ML (MISCELLANEOUS) ×3 IMPLANT
CLOSURE WOUND 1/2 X4 (GAUZE/BANDAGES/DRESSINGS) ×1
DISSECT BALLN SPACEMKR OVL PDB (BALLOONS) ×3
DISSECT BALLN SPACEMKR RND PDB (MISCELLANEOUS)
DISSECTOR BALLN SPCMKR OVL PDB (BALLOONS) ×1 IMPLANT
DISSECTOR BALLN SPCMKR RND PDB (MISCELLANEOUS) IMPLANT
GAUZE SPONGE NON-WVN 2X2 STRL (MISCELLANEOUS) ×1 IMPLANT
GLOVE BIO SURGEON STRL SZ8 (GLOVE) ×3 IMPLANT
GOWN STRL REUS W/ TWL LRG LVL3 (GOWN DISPOSABLE) ×2 IMPLANT
GOWN STRL REUS W/TWL LRG LVL3 (GOWN DISPOSABLE) ×4
IRRIGATION STRYKERFLOW (MISCELLANEOUS) IMPLANT
IRRIGATOR STRYKERFLOW (MISCELLANEOUS)
LABEL OR SOLS (LABEL) IMPLANT
MESH HERNIA 4X6 PROLITE RECT (Mesh General) ×2 IMPLANT
MESH POLY 4X6 (Mesh General) ×4 IMPLANT
NDL SAFETY 22GX1.5 (NEEDLE) ×3 IMPLANT
NS IRRIG 500ML POUR BTL (IV SOLUTION) ×6 IMPLANT
PACK LAP CHOLECYSTECTOMY (MISCELLANEOUS) ×3 IMPLANT
SCISSORS METZENBAUM CVD 33 (INSTRUMENTS) IMPLANT
SEAL FOR SCOPE WARMER C3101 (MISCELLANEOUS) IMPLANT
SPONGE LAP 18X18 5 PK (GAUZE/BANDAGES/DRESSINGS) ×3 IMPLANT
SPONGE VERSALON 2X2 STRL (MISCELLANEOUS) ×2
STRIP CLOSURE SKIN 1/2X4 (GAUZE/BANDAGES/DRESSINGS) ×2 IMPLANT
SURGILUBE 2OZ TUBE FLIPTOP (MISCELLANEOUS) ×3 IMPLANT
SUT ETHIBOND 0 (SUTURE) ×3 IMPLANT
SUT MNCRL 4-0 (SUTURE) ×4
SUT MNCRL 4-0 27XMFL (SUTURE) ×2
SUT VICRYL 0 AB UR-6 (SUTURE) ×3 IMPLANT
SUTURE MNCRL 4-0 27XMF (SUTURE) ×2 IMPLANT
TACKER 5MM HERNIA 3.5CML NAB (ENDOMECHANICALS) ×3 IMPLANT
TROCAR 5MM SINGLE VERSAONE (TROCAR) ×6 IMPLANT
TROCAR BALLN 10M OMST10SB SPAC (TROCAR) IMPLANT
TROCAR Z-THREAD FIOS 11X100 BL (TROCAR) IMPLANT
TUBING INSUFFLATOR HI FLOW (MISCELLANEOUS) ×3 IMPLANT
WATER STERILE IRR 1000ML POUR (IV SOLUTION) IMPLANT

## 2015-08-03 NOTE — Anesthesia Preprocedure Evaluation (Signed)
Anesthesia Evaluation  Patient identified by MRN, date of birth, ID band Patient awake    Reviewed: Allergy & Precautions, NPO status , Patient's Chart, lab work & pertinent test results, reviewed documented beta blocker date and time   Airway Mallampati: II  TM Distance: >3 FB     Dental  (+) Chipped   Pulmonary           Cardiovascular      Neuro/Psych    GI/Hepatic GERD  ,  Endo/Other    Renal/GU Renal InsufficiencyRenal disease     Musculoskeletal   Abdominal   Peds  Hematology   Anesthesia Other Findings   Reproductive/Obstetrics                             Anesthesia Physical Anesthesia Plan  ASA: II  Anesthesia Plan: General   Post-op Pain Management:    Induction: Intravenous  Airway Management Planned: Oral ETT  Additional Equipment:   Intra-op Plan:   Post-operative Plan:   Informed Consent: I have reviewed the patients History and Physical, chart, labs and discussed the procedure including the risks, benefits and alternatives for the proposed anesthesia with the patient or authorized representative who has indicated his/her understanding and acceptance.     Plan Discussed with: CRNA  Anesthesia Plan Comments:         Anesthesia Quick Evaluation

## 2015-08-03 NOTE — Anesthesia Procedure Notes (Signed)
Procedure Name: Intubation Date/Time: 08/03/2015 12:08 PM Performed by: Tonia Ghent Pre-anesthesia Checklist: Patient identified, Emergency Drugs available, Suction available, Patient being monitored and Timeout performed Patient Re-evaluated:Patient Re-evaluated prior to inductionOxygen Delivery Method: Circle system utilized Preoxygenation: Pre-oxygenation with 100% oxygen Intubation Type: IV induction Ventilation: Mask ventilation without difficulty Grade View: Grade I Tube type: Oral Tube size: 7.5 mm Number of attempts: 1 Airway Equipment and Method: Stylet Placement Confirmation: ETT inserted through vocal cords under direct vision,  positive ETCO2,  CO2 detector and breath sounds checked- equal and bilateral Secured at: 21 cm Tube secured with: Tape Dental Injury: Teeth and Oropharynx as per pre-operative assessment

## 2015-08-03 NOTE — Op Note (Signed)
Laparoscopic Inguinal Hernia Repair  Bob Liu  05/17/2015  Pre-operative Diagnosis: Bilateral Inguinal Hernia,   Post-operative Diagnosis: Bilateral  Inguinal hernia,   Procedure: Laparoscopic preperitoneal repair of bilateral inguinal hernia,    Surgeon: Adah Salvage. Excell Seltzer, MD FACS  Anesthesia: Gen. with endotracheal tube  Assistant: tech  Procedure Details  The patient was seen again in the Holding Room. The benefits, complications, treatment options, and expected outcomes were discussed with the patient. The risks of bleeding, infection, recurrence of symptoms, failure to resolve symptoms, recurrence of hernia, ischemic orchitis, chronic pain syndrome or neuroma, were discussed again. The likelihood of improving the patient's symptoms with return to their baseline status is good.  The patient and/or family concurred with the proposed plan, giving informed consent.  The patient was taken to Operating Room, identified and the procedure verified as Laparoscopic bilateral Inguinal Hernia Repair   A Time Out was held and the above information confirmed.  Prior to the induction of general anesthesia, antibiotic prophylaxis was administered. VTE prophylaxis was in place. General endotracheal anesthesia was then administered and tolerated well. A Foley catheter was placed by the nursing staff. After the induction, the abdomen was prepped with Chloraprep and draped in the sterile fashion. The patient was positioned in the supine position.  Local anesthetic  was injected into the skin near the umbilicus and an incision made.   An incision was made and dissection down to the right rectus fascia was performed. The fascia was incised and the muscle retracted laterally. The Covidien dissecting balloon was placed followed by the structural balloon. The preperitoneal space was insufflated and under direct vision 2 midline 5 mm ports were placed.  Dissection was performed to delineate Bob Liu's  ligament and the lateral extent of dissection was determined. The nerve on the lateral abdominal wall was identified and kept in view at all times. The cord was skeletonized of the indirect sac and cord lipoma which was retracted cephalad. On the left side was asmall indirect sac which was dissected off of the cord structures and retracted cephalad. On the right was a small indirect sac as well. The floors were fairly strong.  Once this was complete, a laterally scissored Atrium mesh was placed into the preperitoneal space on each side. It was held in place with the Covidien tacking device avoiding the area of the nerve. Once assuring that the hernia was completely repaired and adequately covered, the preperitoneal space was desufflated under direct vision. There was no sign of peritoneal rent and no sign of bowel intrusion towards the   Once assuring that hemostasis was adequate the ports were removed and a figure-of-eight 0 Vicryl suture was placed at the fascial edges. 4-0 subcuticular Monocryl was used at all skin edges. Steri-Strips and Mastisol and sterile dressings were placed.  Patient tolerated the procedure well. There were no complications. He was taken to the recovery room in stable condition to be discharged to the care of his family and follow-up in 10 days.    Findings: Bilateral inguinal hernias                    Martie Fulgham E. Excell Seltzer, MD, FACS

## 2015-08-03 NOTE — Progress Notes (Signed)
Preoperative Review   Patient is met in the preoperative holding area. The history is reviewed in the chart and with the patient. I personally reviewed the options and rationale as well as the risks of this procedure that have been previously discussed with the patient. All questions asked by the patient and/or family were answered to their satisfaction.  No new medical problems or changes. PE unchanged and stable  Rev'd with mult family members present.  Patient agrees to proceed with this procedure at this time.  Florene Glen M.D. FACS

## 2015-08-03 NOTE — Discharge Instructions (Addendum)
Remove dressing in 24 hours. °May shower in 24 hours. °Leave paper strips in place. °Resume all home medications. °Follow-up with Dr. Cooper in 10 days. °

## 2015-08-03 NOTE — Anesthesia Postprocedure Evaluation (Signed)
  Anesthesia Post-op Note  Patient: Bob Liu  Procedure(s) Performed: Procedure(s): LAPAROSCOPIC INGUINAL HERNIA (Bilateral)  Anesthesia type:General  Patient location: PACU  Post pain: Pain level controlled  Post assessment: Post-op Vital signs reviewed, Patient's Cardiovascular Status Stable, Respiratory Function Stable, Patent Airway and No signs of Nausea or vomiting  Post vital signs: Reviewed and stable  Last Vitals:  Filed Vitals:   08/03/15 1420  BP: 122/64  Pulse: 68  Temp: 36.8 C  Resp: 16    Level of consciousness: awake, alert  and patient cooperative  Complications: No apparent anesthesia complications

## 2015-08-03 NOTE — Transfer of Care (Signed)
Immediate Anesthesia Transfer of Care Note  Patient: Bob Liu  Procedure(s) Performed: Procedure(s): LAPAROSCOPIC INGUINAL HERNIA (Bilateral)  Patient Location: PACU  Anesthesia Type:General  Level of Consciousness: awake, alert  and oriented  Airway & Oxygen Therapy: Patient Spontanous Breathing and Patient connected to face mask oxygen  Post-op Assessment: Report given to RN and Post -op Vital signs reviewed and stable  Post vital signs: Reviewed and stable  Last Vitals:  Filed Vitals:   08/03/15 1111  BP: 121/68  Pulse: 67  Temp: 36.6 C  Resp: 16    Complications: No apparent anesthesia complications

## 2015-08-11 ENCOUNTER — Other Ambulatory Visit: Payer: Self-pay | Admitting: *Deleted

## 2015-08-15 ENCOUNTER — Encounter: Payer: Self-pay | Admitting: Surgery

## 2015-08-15 ENCOUNTER — Ambulatory Visit (INDEPENDENT_AMBULATORY_CARE_PROVIDER_SITE_OTHER): Payer: Commercial Managed Care - HMO | Admitting: Surgery

## 2015-08-15 VITALS — BP 141/77 | HR 65 | Temp 98.1°F | Ht 72.0 in | Wt 215.0 lb

## 2015-08-15 DIAGNOSIS — K4020 Bilateral inguinal hernia, without obstruction or gangrene, not specified as recurrent: Secondary | ICD-10-CM

## 2015-08-15 NOTE — Progress Notes (Signed)
Outpatient postop visit  08/15/2015  Bob Liu is an 34 y.o. male.    Procedure: Laparoscopic bilateral inguinal hernia repair  CC: Minimal testicular pain  HPI: Patient took 3 analgesic pills in the postoperative period and experience pain only for the first 2 days he feels well now and wants to go to work.  Medications reviewed.    Physical Exam:  BP 141/77 mmHg  Pulse 65  Temp(Src) 98.1 F (36.7 C)  Ht 6' (1.829 m)  Wt 215 lb (97.523 kg)  BMI 29.15 kg/m2    PE: Normal ecchymosis nontender wounds clean    Assessment/Plan:  Patient doing very well recommend follow up on an as-needed basis no limitations at this point.  Lattie Hawichard E Pariss Hommes, MD, FACS

## 2015-08-15 NOTE — Patient Instructions (Signed)
Let us know if you have any questions!

## 2015-08-27 IMAGING — US US SCROTUM W/ DOPPLER COMPLETE
1 series · 14 of 25 positions shown · non-contrast
Comparison: None.

CLINICAL DATA: Testicular pain

EXAM:
SCROTAL ULTRASOUND
DOPPLER ULTRASOUND OF THE TESTICLES
TECHNIQUE: Complete ultrasound examination of the testicles, epididymis, and
other scrotal structures was performed. Color and spectral Doppler
ultrasound were also utilized to evaluate blood flow to the
testicles.

[Series 1: us scrotum w/ doppler complete · 0.06mm/px · 14 of 67 slices shown]
[im 1/67]
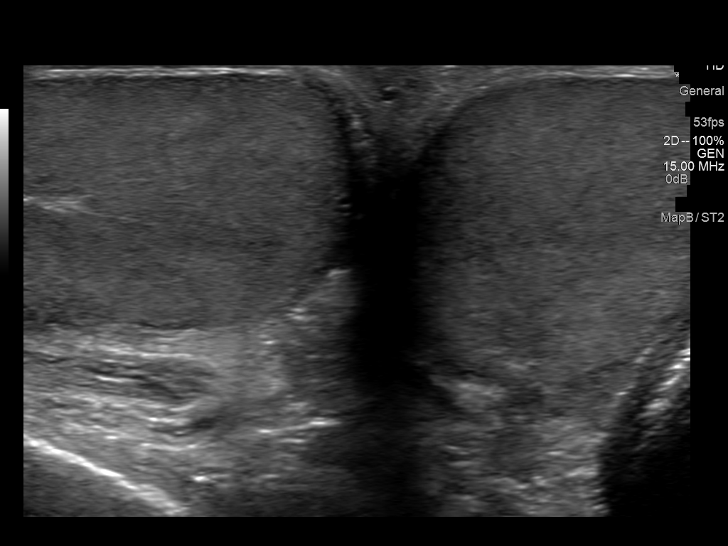
[im 6/67]
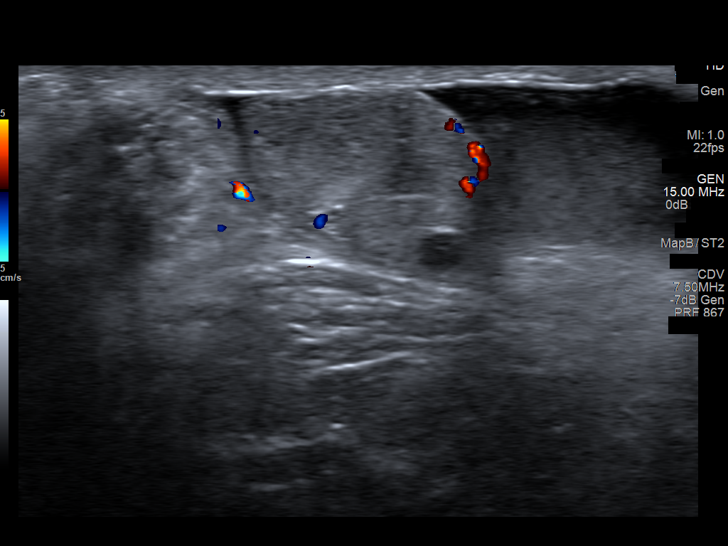
[im 12/67]
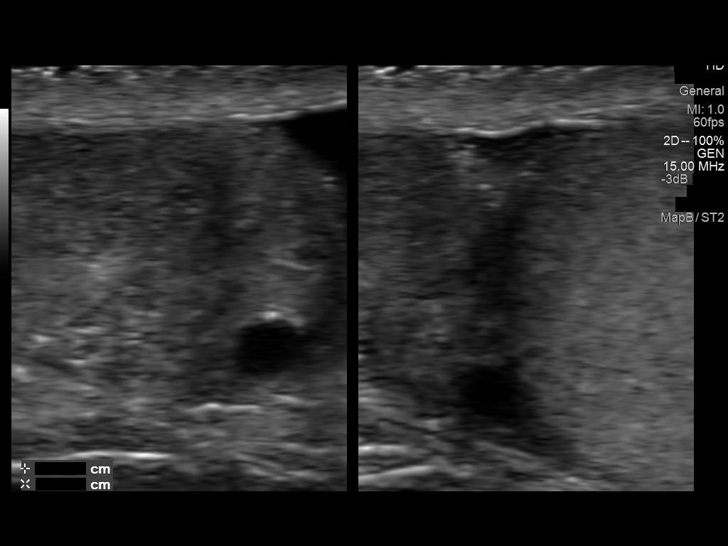
[im 17/67]
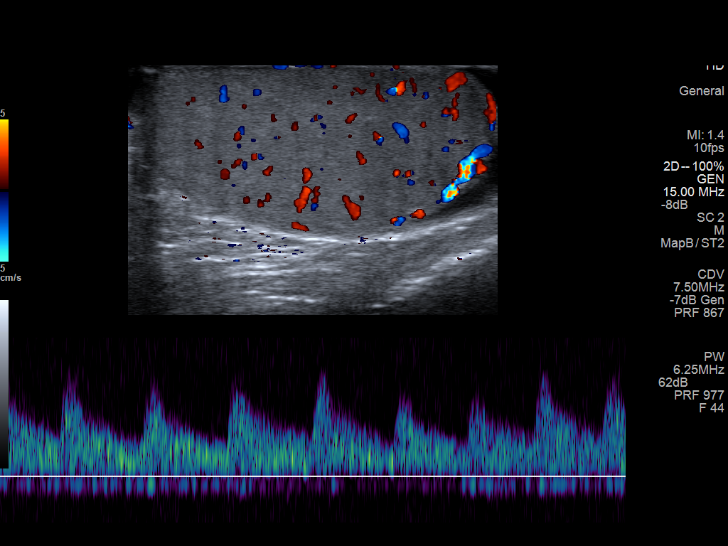
[im 23/67]
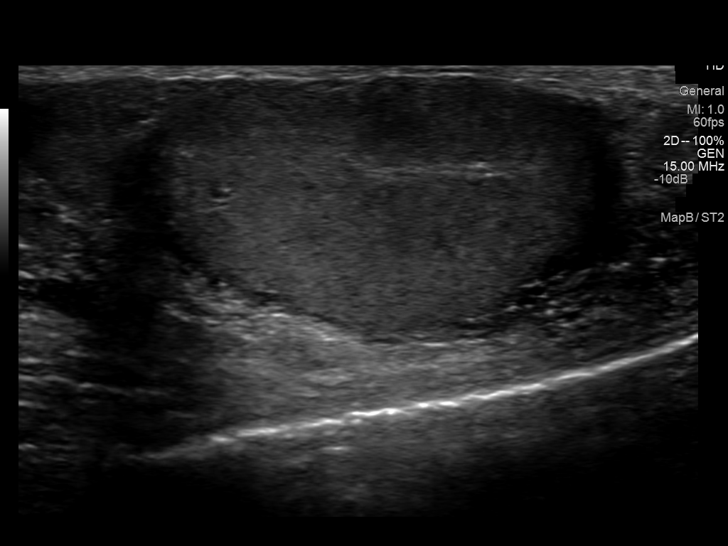
[im 25/67]
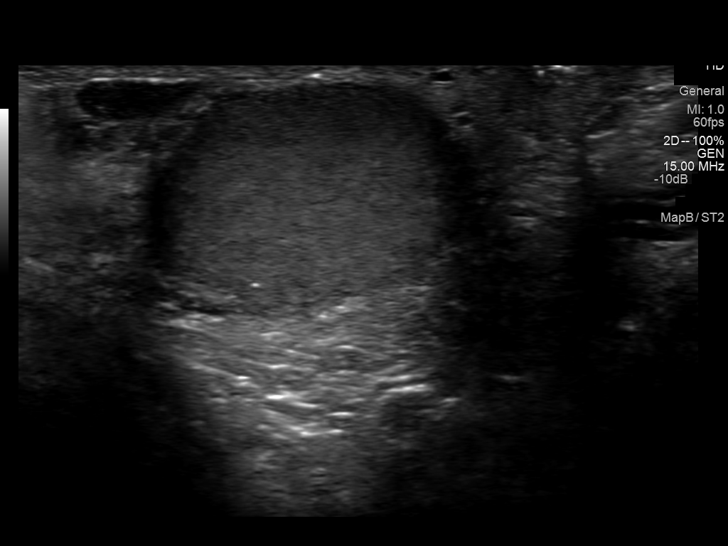
[im 31/67]
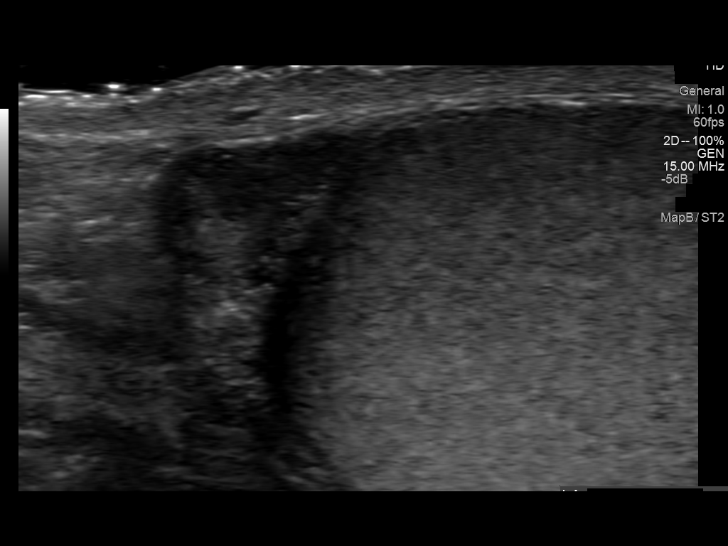
[im 36/67]
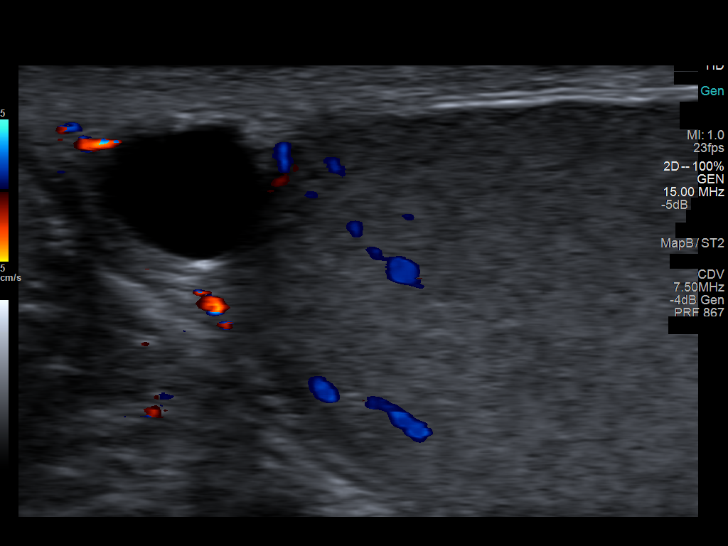
[im 42/67]
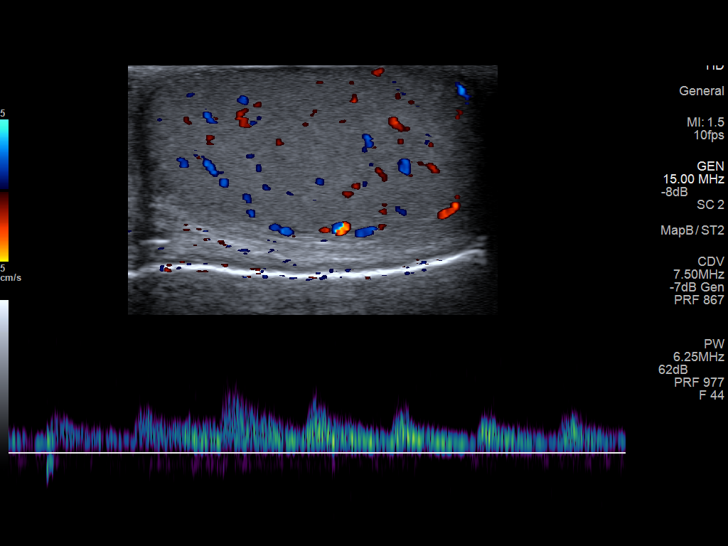
[im 45/67]
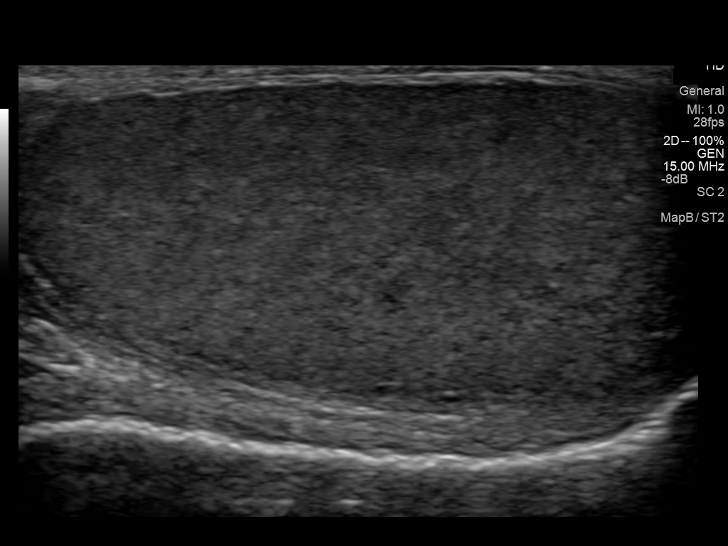
[im 50/67]
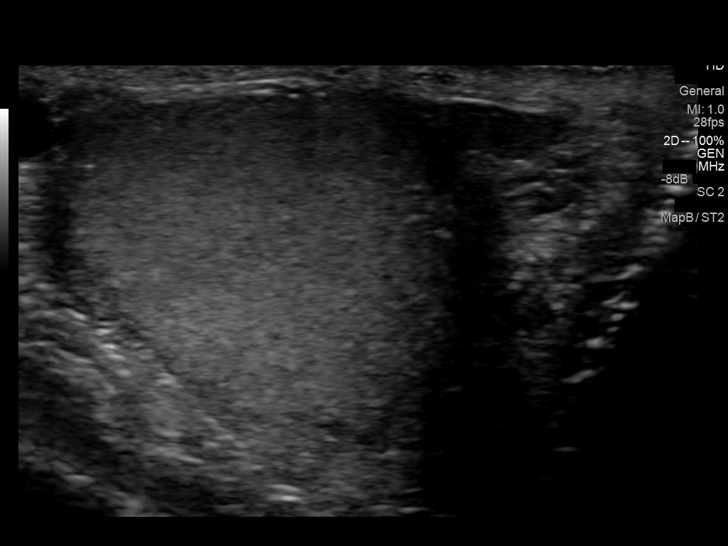
[im 56/67]
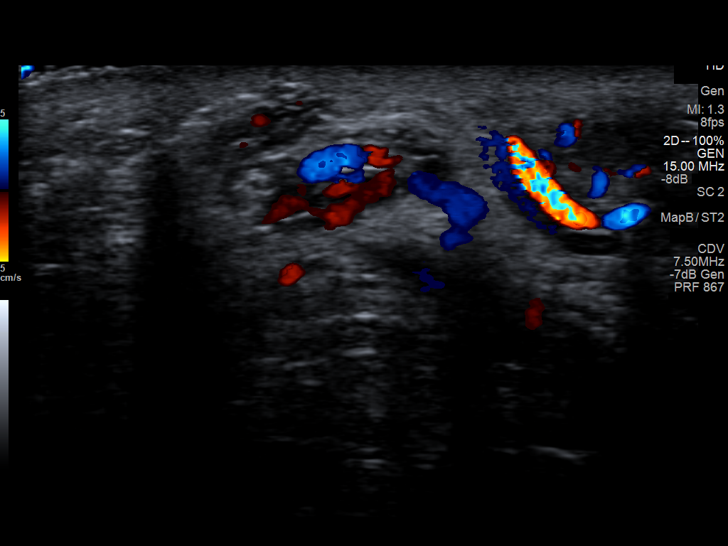
[im 61/67]
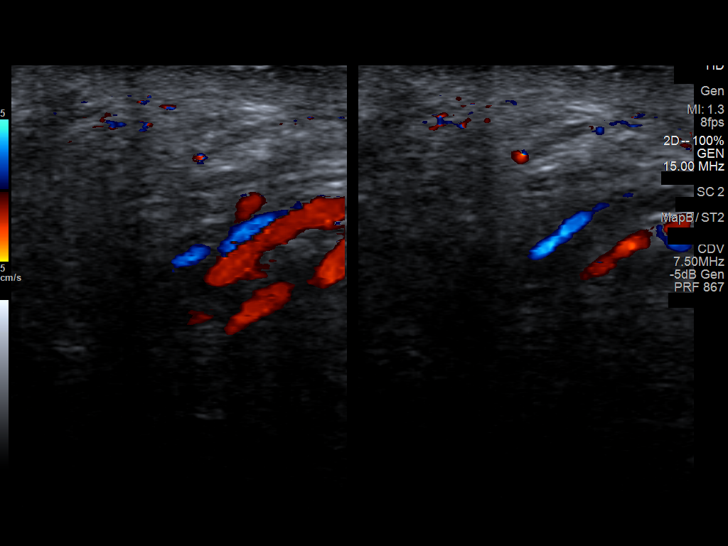
[im 67/67]
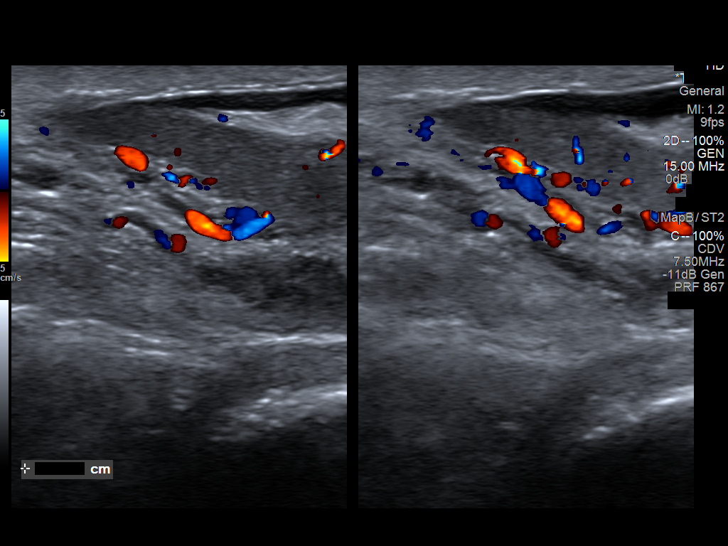

[14 of 25 positions shown; findings below may reference images not displayed]

FINDINGS: Right testicle

Measurements: 4.8 x 2.3 x 3.3 cm. No mass or microlithiasis
visualized.

Left testicle

Measurements: 4.9 x 2.1 x 3.3 cm. No mass or microlithiasis
visualized.

Right epididymis: Small epididymal cysts noted largest measures 4 mm

Left epididymis: Small epididymal cysts evident, largest measures 7
mm

Hydrocele:  None visualized.

Varicocele:  None visualized.

Pulsed Doppler interrogation of both testes demonstrates low
resistance arterial and venous waveforms bilaterally.
IMPRESSION: No acute finding by ultrasound.  Negative for torsion.

## 2016-04-04 ENCOUNTER — Telehealth: Payer: Self-pay | Admitting: Surgery

## 2016-04-04 NOTE — Telephone Encounter (Signed)
Patient had laparoscopic inguinal hernia surgery with Dr Excell Seltzerooper on 08/03/15. He thinks he may have injured the hernia again, but isn't sure if it is a groin pull. He says the pain feels the same as hernia. He wanted to speak with a nurse to discuss this before scheduling an appointment to come in and see Dr Excell Seltzerooper. Please call and advise.

## 2016-04-04 NOTE — Telephone Encounter (Signed)
I spoke with patient and he denies fever,  bulge, nausea or vomiting, but is currently having pain.  He stated he is exercising, running and lifting weights and is concerned he has injured the hernia repair. He is scheduled to see Dr.Cooper on 04/19/16 @ 10:00 am.

## 2016-04-12 ENCOUNTER — Other Ambulatory Visit: Payer: Self-pay

## 2016-04-19 ENCOUNTER — Ambulatory Visit (INDEPENDENT_AMBULATORY_CARE_PROVIDER_SITE_OTHER): Payer: Commercial Managed Care - HMO | Admitting: Surgery

## 2016-04-19 ENCOUNTER — Encounter: Payer: Self-pay | Admitting: Surgery

## 2016-04-19 VITALS — BP 136/73 | HR 72 | Temp 95.9°F | Ht 72.0 in | Wt 197.0 lb

## 2016-04-19 DIAGNOSIS — R1030 Lower abdominal pain, unspecified: Secondary | ICD-10-CM | POA: Diagnosis not present

## 2016-04-19 NOTE — Progress Notes (Signed)
Outpatient Surgical Follow Up  04/19/2016  Bob Liu is an 35 y.o. male.   OZ:HYQMVCC:groin pain  HPI: This a patient who underwent a laparoscopic preperitoneal repair of bilateral inguinal hernias in October 2016. He recently has experienced some mild groin pain more on the right than on the left but bilateral is had no nausea or vomiting and does not notice a bulge. He notices the pain most when he is doing excessive ab work Tax inspectorouts or laying on the floor and lifting his legs up. It does not seem to be influencing his lifestyle. He is able to work. He has no testicular pain but does have bilateral pain more on the right than on the left.  Past Medical History  Diagnosis Date  . Kidney stones   . Inguinal hernia 06/29/2015  . GERD (gastroesophageal reflux disease)     Past Surgical History  Procedure Laterality Date  . Cholecystectomy    . Inguinal hernia repair Bilateral 08/03/2015    Procedure: LAPAROSCOPIC INGUINAL HERNIA;  Surgeon: Lattie Hawichard E Maddoxx Burkitt, MD;  Location: [facility] ORS;  Service: General;  Laterality: Bilateral;    Family History  Problem Relation Age of Onset  . Inguinal hernia Neg Hx   . Macular degeneration Mother   . Diabetes Father   . Hypertension Father     Social History:  reports that he has never smoked. He does not have any smokeless tobacco history on file. He reports that he drinks alcohol. He reports that he does not use illicit drugs.  Allergies: No Known Allergies  Medications reviewed.   Review of Systems:   Review of Systems  Constitutional: Negative.   HENT: Negative.   Eyes: Negative.   Respiratory: Negative.   Cardiovascular: Negative.   Gastrointestinal: Negative.   Genitourinary: Negative.   Musculoskeletal: Negative.   Skin: Negative.   Neurological: Negative.   Endo/Heme/Allergies: Negative.   Psychiatric/Behavioral: Negative.      Physical Exam:  BP 136/73 mmHg  Pulse 72  Temp(Src) 95.9 F (35.5 C) (Oral)  Ht 6' (1.829 m)  Wt  197 lb (89.359 kg)  BMI 26.71 kg/m2  Physical Exam  Constitutional: He is oriented to person, place, and time and well-developed, well-nourished, and in no distress. No distress.  Muscular appearing in no acute distress  HENT:  Head: Normocephalic and atraumatic.  Eyes: Pupils are equal, round, and reactive to light. Right eye exhibits no discharge. Left eye exhibits no discharge. No scleral icterus.  Neck: Normal range of motion. Neck supple.  Abdominal: Soft. He exhibits no distension. There is no tenderness. There is no rebound and no guarding.  Well-healed scars from prior bilateral inguinal hernia repair  Genitourinary: Penis normal.  Patient is examined standing supine and with Valsalva. No sign of recurrence on either side and normal testicles without tenderness  Neurological: He is alert and oriented to person, place, and time.  Skin: Skin is warm and dry. No rash noted. He is not diaphoretic. No erythema.  Psychiatric: Mood and affect normal.      No results found for this or any previous visit (from the past 48 hour(s)). No results found.  Assessment/Plan:  This patient with groin pain exacerbated by exertion especially abs exercises when elevating legs off of the ground 1 laying in the supine position. I see no sign of recurrence at this point I suspect that this this is all musculoskeletal and will recommend nonsteroidal anti-inflammatories certainly there is no surgical indication at this point and no sign  of recurrence. I will see him  back in 3 months.  Lattie Hawichard E Shelia Magallon, MD, FACS

## 2016-04-19 NOTE — Patient Instructions (Signed)
Please call our office if you have questions or concerns.   

## 2016-05-08 ENCOUNTER — Telehealth: Payer: Self-pay

## 2016-05-08 NOTE — Telephone Encounter (Signed)
LVM for patient to call office. Patient needs an appointment for mid September-schedule was not available when patient was her at last office visit.

## 2016-05-09 NOTE — Telephone Encounter (Signed)
LVM for patient to call so we can schedule him an appointment for September.

## 2016-05-22 NOTE — Telephone Encounter (Signed)
Letter sent to patient at this time. Will schedule patient when he returns call.

## 2016-06-06 ENCOUNTER — Telehealth: Payer: Self-pay

## 2016-06-06 NOTE — Telephone Encounter (Signed)
Attempted to reach patient at this time, however no answer. I mailed a letter asking he call our office to schedule an appointment and no response.   Please see notes regarding trying to reach patient and unsuccessful.

## 2017-03-19 DIAGNOSIS — H66002 Acute suppurative otitis media without spontaneous rupture of ear drum, left ear: Secondary | ICD-10-CM | POA: Diagnosis not present

## 2017-05-11 IMAGING — US US RENAL
1 series · 14 of 25 positions shown · non-contrast
Comparison: CT scan 11/23/2008

CLINICAL DATA: One week history of left flank pain.

EXAM:
RENAL / URINARY TRACT ULTRASOUND COMPLETE

[Series 1: us renal · 0.22mm/px · 14 of 33 slices shown]
[im 1/33]
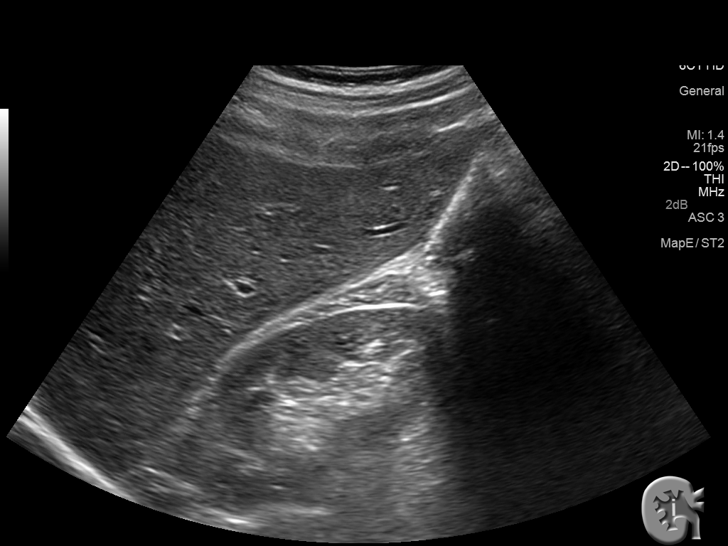
[im 3/33]
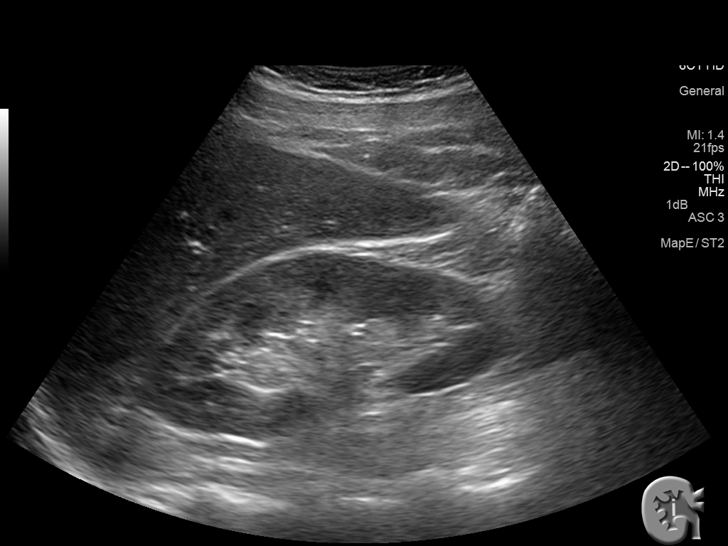
[im 6/33]
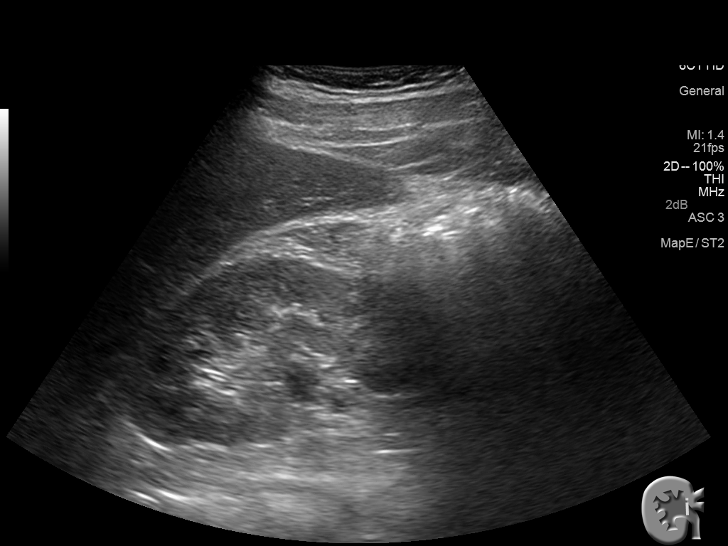
[im 9/33]
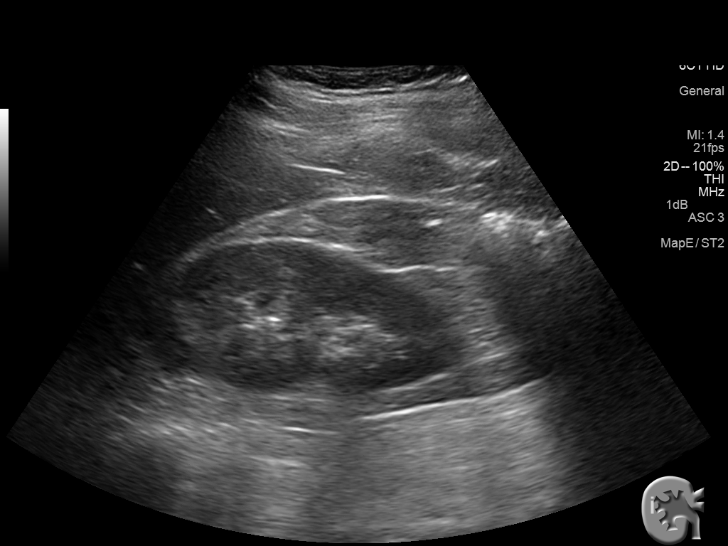
[im 11/33]
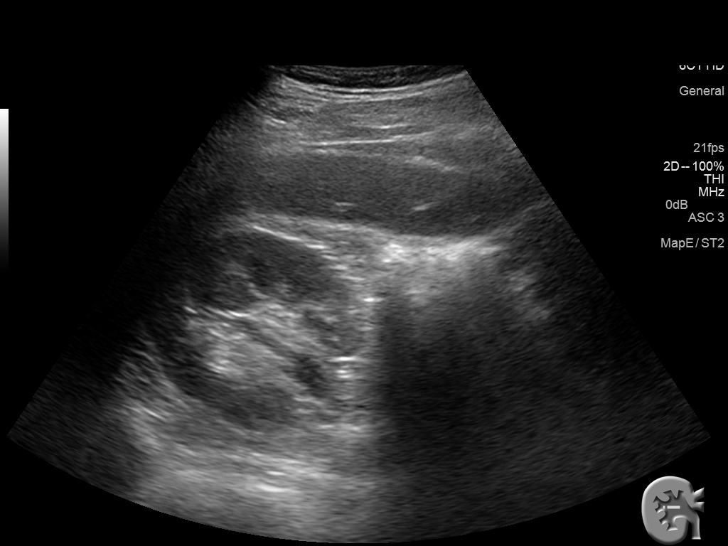
[im 13/33]
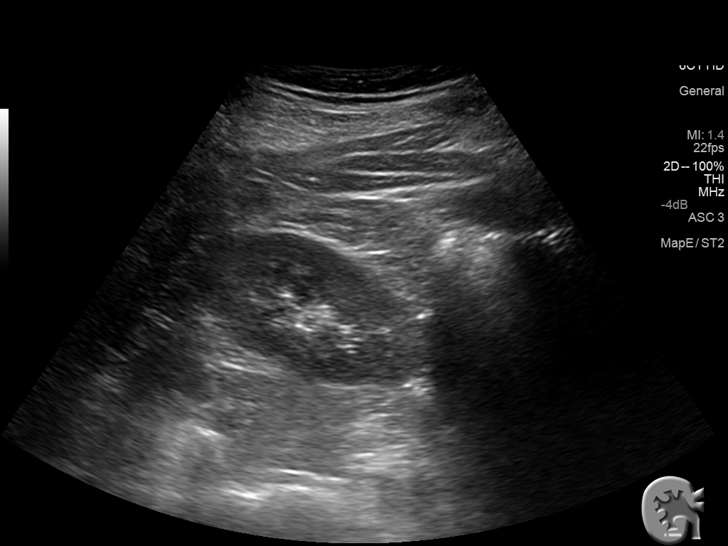
[im 15/33]
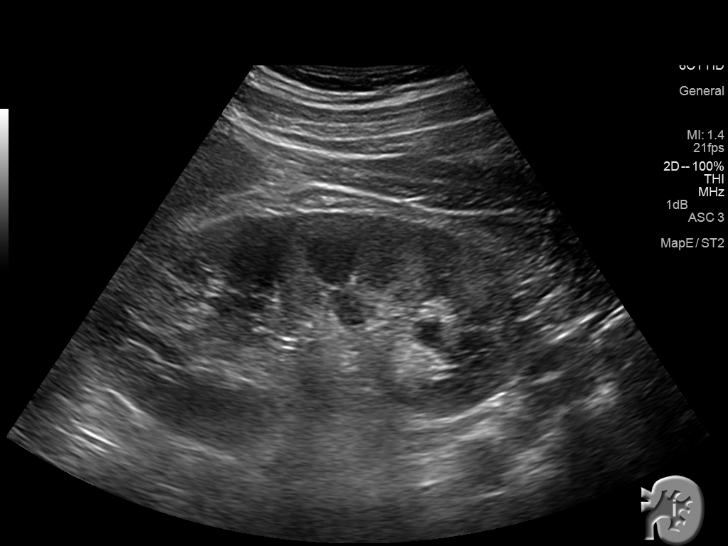
[im 18/33]
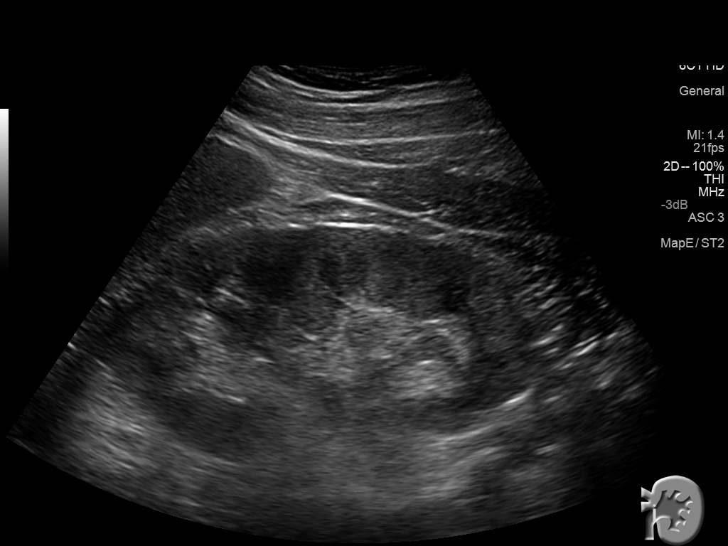
[im 21/33]
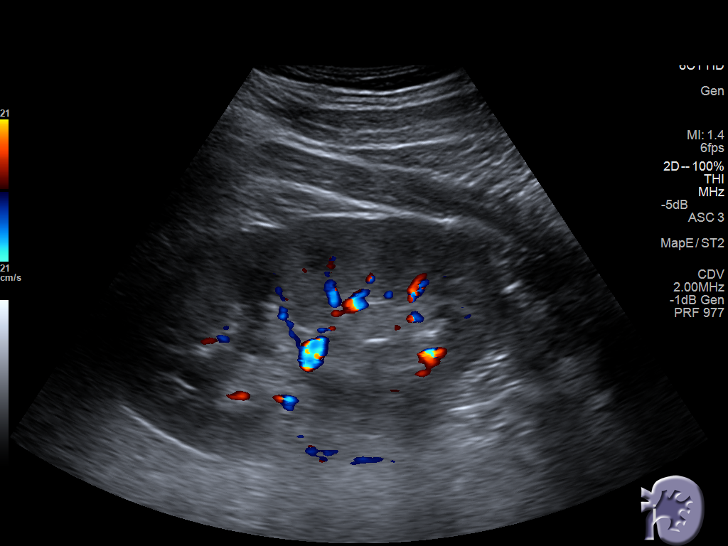
[im 22/33]
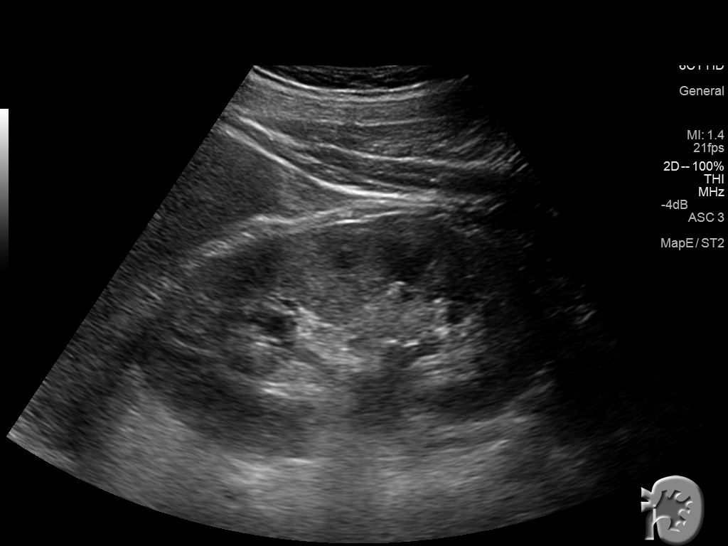
[im 25/33]
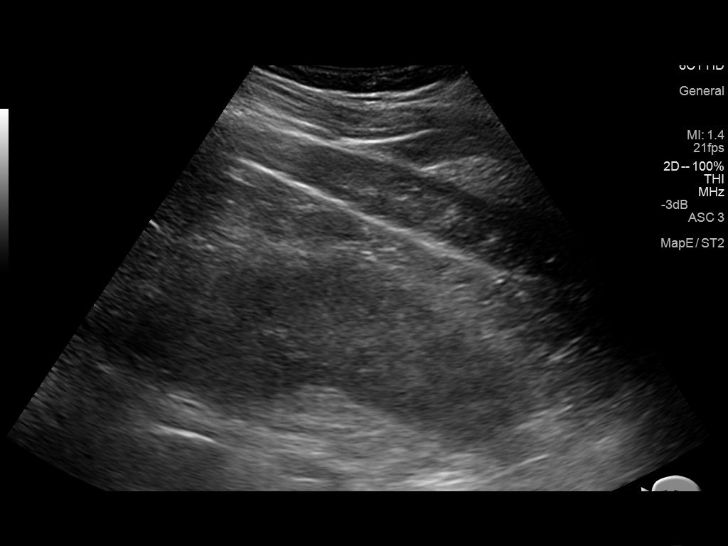
[im 27/33]
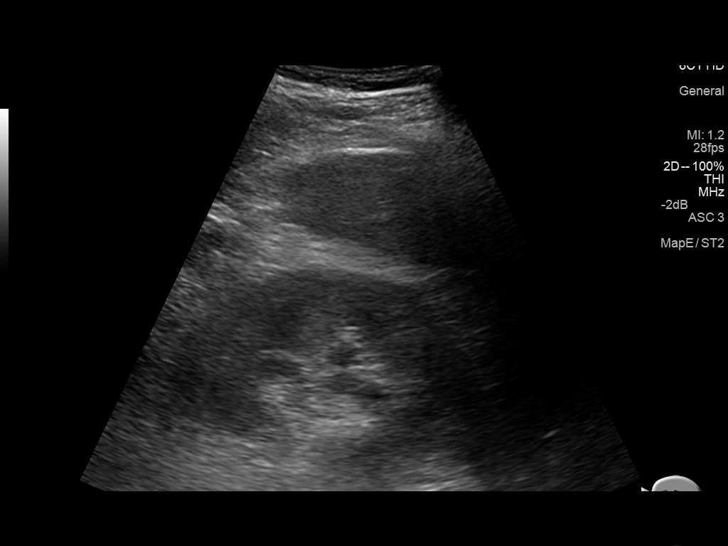
[im 30/33]
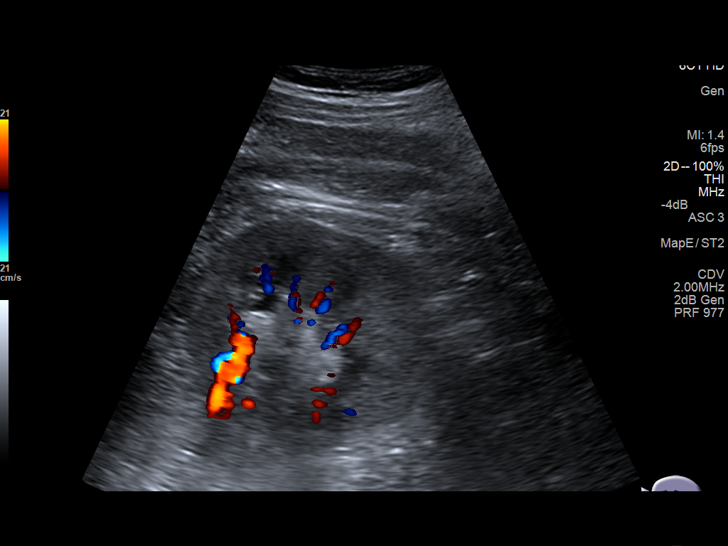
[im 33/33]
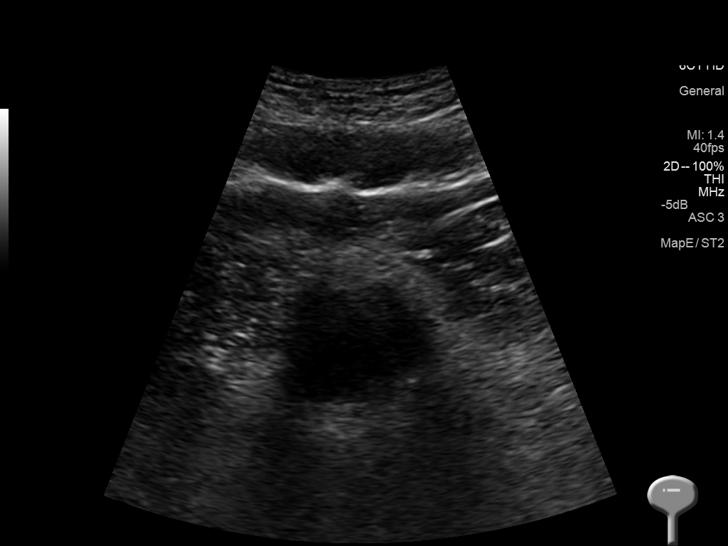

[14 of 25 positions shown; findings below may reference images not displayed]

FINDINGS: Right Kidney:

Length: 12.0 cm. Normal renal cortical thickness and echogenicity
without focal lesions or hydronephrosis.

Left Kidney:

Length: 13.6 cm. Mild hydronephrosis. No definite renal calculi.
Normal renal cortical thickness and echogenicity.

Bladder:

Poorly distended.
IMPRESSION: Mild left-sided hydronephrosis.  No obvious renal calculi.

## 2017-11-30 DIAGNOSIS — J029 Acute pharyngitis, unspecified: Secondary | ICD-10-CM | POA: Diagnosis not present

## 2017-12-02 DIAGNOSIS — J069 Acute upper respiratory infection, unspecified: Secondary | ICD-10-CM | POA: Diagnosis not present

## 2017-12-13 DIAGNOSIS — R197 Diarrhea, unspecified: Secondary | ICD-10-CM | POA: Diagnosis not present

## 2017-12-25 DIAGNOSIS — J029 Acute pharyngitis, unspecified: Secondary | ICD-10-CM | POA: Diagnosis not present

## 2017-12-25 DIAGNOSIS — J020 Streptococcal pharyngitis: Secondary | ICD-10-CM | POA: Diagnosis not present

## 2018-06-13 DIAGNOSIS — R079 Chest pain, unspecified: Secondary | ICD-10-CM | POA: Diagnosis not present

## 2018-06-13 DIAGNOSIS — R748 Abnormal levels of other serum enzymes: Secondary | ICD-10-CM | POA: Diagnosis not present

## 2018-06-13 DIAGNOSIS — K219 Gastro-esophageal reflux disease without esophagitis: Secondary | ICD-10-CM | POA: Diagnosis not present

## 2018-06-13 DIAGNOSIS — E785 Hyperlipidemia, unspecified: Secondary | ICD-10-CM | POA: Diagnosis not present

## 2020-05-20 ENCOUNTER — Encounter: Payer: Self-pay | Admitting: Emergency Medicine

## 2020-05-20 ENCOUNTER — Other Ambulatory Visit: Payer: Self-pay

## 2020-05-20 ENCOUNTER — Emergency Department
Admission: EM | Admit: 2020-05-20 | Discharge: 2020-05-20 | Disposition: A | Discharge status: Home / Self Care | Payer: 59 | Attending: Emergency Medicine | Admitting: Emergency Medicine

## 2020-05-20 DIAGNOSIS — R0602 Shortness of breath: Secondary | ICD-10-CM | POA: Diagnosis present

## 2020-05-20 DIAGNOSIS — Z5321 Procedure and treatment not carried out due to patient leaving prior to being seen by health care provider: Secondary | ICD-10-CM | POA: Diagnosis not present

## 2020-05-20 DIAGNOSIS — U071 COVID-19: Secondary | ICD-10-CM | POA: Insufficient documentation

## 2020-05-20 NOTE — ED Triage Notes (Signed)
Pt in with co shortness of breath, no distress noted. Daughter tested positive for covid Monday. No fever, states has general malaise and chills.

## 2020-05-29 ENCOUNTER — Encounter: Payer: Self-pay | Admitting: Emergency Medicine

## 2020-05-29 ENCOUNTER — Emergency Department
Admission: EM | Admit: 2020-05-29 | Discharge: 2020-05-29 | Disposition: A | Discharge status: Home / Self Care | Payer: 59 | Attending: Emergency Medicine | Admitting: Emergency Medicine

## 2020-05-29 ENCOUNTER — Emergency Department: Payer: 59

## 2020-05-29 ENCOUNTER — Other Ambulatory Visit: Payer: Self-pay

## 2020-05-29 DIAGNOSIS — U071 COVID-19: Secondary | ICD-10-CM | POA: Diagnosis not present

## 2020-05-29 DIAGNOSIS — R0789 Other chest pain: Secondary | ICD-10-CM

## 2020-05-29 DIAGNOSIS — R0602 Shortness of breath: Secondary | ICD-10-CM | POA: Diagnosis present

## 2020-05-29 LAB — CBC
HCT: 44.8 % (ref 39.0–52.0)
Hemoglobin: 15.6 g/dL (ref 13.0–17.0)
MCH: 30 pg (ref 26.0–34.0)
MCHC: 34.8 g/dL (ref 30.0–36.0)
MCV: 86.2 fL (ref 80.0–100.0)
Platelets: 191 10*3/uL (ref 150–400)
RBC: 5.2 MIL/uL (ref 4.22–5.81)
RDW: 12.1 % (ref 11.5–15.5)
WBC: 5.8 10*3/uL (ref 4.0–10.5)
nRBC: 0 % (ref 0.0–0.2)

## 2020-05-29 LAB — TROPONIN I (HIGH SENSITIVITY): Troponin I (High Sensitivity): 2 ng/L (ref <18)

## 2020-05-29 LAB — BASIC METABOLIC PANEL
Anion gap: 8 (ref 5–15)
BUN: 12 mg/dL (ref 6–20)
CO2: 28 mmol/L (ref 22–32)
Calcium: 8.9 mg/dL (ref 8.9–10.3)
Chloride: 104 mmol/L (ref 98–111)
Creatinine, Ser: 1.14 mg/dL (ref 0.61–1.24)
GFR calc Af Amer: >60 mL/min (ref >60)
GFR calc non Af Amer: >60 mL/min (ref >60)
Glucose, Bld: 110 mg/dL — ABNORMAL HIGH (ref 70–99)
Potassium: 3.6 mmol/L (ref 3.5–5.1)
Sodium: 140 mmol/L (ref 135–145)

## 2020-05-29 MED ORDER — ONDANSETRON HCL 4 MG/2ML IJ SOLN
4.0000 mg | Freq: Once | INTRAMUSCULAR | Status: AC
  Administered 2020-05-29: 4 mg via INTRAVENOUS
  Filled 2020-05-29: qty 2

## 2020-05-29 MED ORDER — LACTATED RINGERS IV BOLUS
1000.0000 mL | Freq: Once | INTRAVENOUS | Status: AC
  Administered 2020-05-29: 1000 mL via INTRAVENOUS

## 2020-05-29 MED ORDER — KETOROLAC TROMETHAMINE 30 MG/ML IJ SOLN
15.0000 mg | Freq: Once | INTRAMUSCULAR | Status: AC
  Administered 2020-05-29: 15 mg via INTRAVENOUS
  Filled 2020-05-29: qty 1

## 2020-05-29 MED ORDER — ACETAMINOPHEN 500 MG PO TABS
1000.0000 mg | ORAL_TABLET | Freq: Once | ORAL | Status: AC
  Administered 2020-05-29: 1000 mg via ORAL
  Filled 2020-05-29: qty 2

## 2020-05-29 NOTE — ED Provider Notes (Signed)
[facility] Emergency Department Provider Note ____________________________________________   First MD Initiated Contact with Patient 05/29/20 [phone]     (approximate)  I have reviewed the triage vital signs and the nursing notes.  HISTORY  Chief Complaint Shortness of Breath and Chest Pain   HPI Bob Liu is a 39 y.o. male presents to the ED for evaluation of shortness of breath, burning chest pain and nausea.  Chart review indicates patient was diagnosed with COVID-19 10 days ago.  PCP is [facility].  Prescribed prednisone burst and albuterol as needed on 7/23 by PCP.  Patient reports progressively worsening shortness of breath/dyspnea for the past 2 weeks.  Reports associated nonproductive cough with ambulation, but no productive cough.  Associated fevers up to 101 F, treated at home with Tylenol with transient improvement.  He reports diffuse burning chest pain, worse with respirations, up to 7/10 intensity, also improved with Tylenol transiently.  He reports associated anorexia and poor p.o. intake with associated nausea, but no vomiting.  Reports having watery diarrhea "for a few days" last week, but this is resolved with home Imodium usage.  Patient reports his wife and 2 children are also sick with a similar syndrome, testing positive for COVID-19.  No one in the family is vaccinated.   Past Medical History:  Diagnosis Date  . GERD (gastroesophageal reflux disease)   . Inguinal hernia 06/29/2015  . Kidney stones     Patient Active Problem List   Diagnosis Date Noted  . Inguinal hernia 06/29/2015    Past Surgical History:  Procedure Laterality Date  . CHOLECYSTECTOMY    . INGUINAL HERNIA REPAIR Bilateral 08/03/2015   Procedure: LAPAROSCOPIC INGUINAL HERNIA;  Surgeon: Lattie Haw, MD;  Location: [facility] ORS;  Service: General;  Laterality: Bilateral;    Prior to Admission medications   Medication Sig Start Date End Date Taking?  Authorizing Provider  fexofenadine (ALLEGRA) 180 MG tablet Take 180 mg by mouth daily. Reported on 04/19/2016    [provider]  fluticasone (FLONASE) 50 MCG/ACT nasal spray Place 2 sprays into both nostrils as needed for allergies or rhinitis. Reported on 04/19/2016    [provider]  predniSONE (STERAPRED UNI-PAK 21 TAB) 10 MG (21) TBPK tablet Reported on 04/19/2016 05/23/15   [provider]    Allergies Patient has no known allergies.  Family History  Problem Relation Age of Onset  . Macular degeneration Mother   . Diabetes Father   . Hypertension Father   . Inguinal hernia Neg Hx     Social History Social History   Tobacco Use  . Smoking status: Never Smoker  Substance Use Topics  . Alcohol use: Yes    Alcohol/week: 0.0 standard drinks    Comment: most days  . Drug use: No    Review of Systems  Constitutional: Positive for fevers and chills Eyes: No visual changes. ENT: No sore throat. Cardiovascular: Positive for chest pain. Respiratory: Positive for shortness of breath Gastrointestinal: No abdominal pain.   no vomiting.    No constipation.  Positive for nausea and previous diarrhea. Genitourinary: Negative for dysuria. Musculoskeletal: Negative for back pain. Skin: Negative for rash. Neurological: Negative for headaches, focal weakness or numbness.   ____________________________________________   PHYSICAL EXAM:  VITAL SIGNS: Vitals:   05/29/20 1004 05/29/20 1126  BP:  115/82  Pulse: 87 89  Resp:  20  Temp:  99.2 F (37.3 C)  SpO2: 100% 94%      Constitutional:  Alert and oriented.  Sitting up in bed, dyspneic, conversational in phrases.  Appears uncomfortable. Eyes: Conjunctivae are normal. PERRL. EOMI. Head: Atraumatic. Nose: No congestion/rhinnorhea. Mouth/Throat: Mucous membranes are dry.  Oropharynx non-erythematous. Neck: No stridor. No cervical spine tenderness to palpation. Cardiovascular: Normal rate, regular  rhythm. Grossly normal heart sounds.  Good peripheral circulation. Respiratory:  No retractions. Lungs CTAB.  Tachypneic in the low 20s, no other signs of respiratory distress. Gastrointestinal: Soft , nondistended, nontender to palpation. No abdominal bruits. No CVA tenderness. Musculoskeletal: No lower extremity tenderness nor edema.  No joint effusions. No signs of acute trauma. Neurologic:  Normal speech and language. No gross focal neurologic deficits are appreciated. No gait instability noted. Skin:  Skin is warm, dry and intact. No rash noted. Psychiatric: Mood and affect are normal. Speech and behavior are normal.  ____________________________________________   LABS (all labs ordered are listed, but only abnormal results are displayed)  Labs Reviewed  BASIC METABOLIC PANEL - Abnormal; Notable for the following components:      Result Value   Glucose, Bld 110 (*)    All other components within normal limits  CBC  TROPONIN I (HIGH SENSITIVITY)  TROPONIN I (HIGH SENSITIVITY)   ____________________________________________  12 Lead EKG  Sinus rhythm, rate of 101 bpm, normal axis and intervals.  No evidence of acute ischemia. ____________________________________________  RADIOLOGY  ED MD interpretation: CXR with diffuse and mild patchy multifocal infiltrates without evidence of discrete lobar patrician  Official radiology report(s): DG Chest Portable 1 View  Result Date: 05/29/2020 CLINICAL DATA:  Pt to ED via POV for shortness of breath x 10 days. Pt diagnosed with COVID 10 days ago. Pt states that he is having nausea x 3-4 days, not able to eat. Burning pain in chest today. Never a smoker EXAM: PORTABLE CHEST 1 VIEW COMPARISON:  03/21/2010 FINDINGS: Subtle hazy airspace opacities are noted in the left mid and lower lung. Remainder of the lungs is clear. Cardiac silhouette is normal in size. Normal mediastinal and hilar contours. No pleural effusion or pneumothorax. Skeletal  structures grossly intact. IMPRESSION: 1. Subtle hazy airspace lung opacities in the left mid to lower lung consistent with multifocal pneumonia, pattern compatible with COVID-19 infection. Electronically Signed   By: Amie Portland M.D.   On: 05/29/2020 09:55    ____________________________________________   PROCEDURES and INTERVENTIONS  Procedure(s) performed (including Critical Care):  Procedures  Medications  lactated ringers bolus 1,000 mL (0 mLs Intravenous Stopped 05/29/20 1125)  acetaminophen (TYLENOL) tablet 1,000 mg (1,000 mg Oral Given 05/29/20 0954)  ketorolac (TORADOL) 30 MG/ML injection 15 mg (15 mg Intravenous Given 05/29/20 0956)  ondansetron (ZOFRAN) injection 4 mg (4 mg Intravenous Given 05/29/20 0956)    ____________________________________________   INITIAL IMPRESSION / ASSESSMENT AND PLAN / ED COURSE  Otherwise healthy, unvaccinated 39 year old man presenting 10 days into Covid course and amenable to continued outpatient management.  Normal vital signs on room air.  Exam with uncomfortable-appearing dyspneic patient without distress, beyond his mild tachypnea.  Otherwise without evidence of acute pathology.  CXR with expected infiltrates, without evidence of discrete lobar infiltration to suggest a bacterial pneumonia.  Unremarkable blood work.  Provided symptomatic measures including fluids, Tylenol/Toradol with improvement of his symptoms.  Patient subsequently ambulating without distress, hypoxia or complication.  We discussed outpatient supportive measures and following up with his PCP, and no current need for hospitalization at this time.  We discussed return precautions for the ED.  Patient medically stable for discharge home.  Clinical Course as of May 29 1532  Sun May 29, 2020  0930 CXR reviewed with mild and patchy diffuse infiltrates without discrete lobar consolidation.   [DS]  1118 Reassessed.  Patient reports marginally improved symptoms.  Awaiting ambulation  trial pulse oximetry to better assess safety of outpatient management.   [DS]  1209 Reassessed.  Patient reports marginally improving symptoms.  No desaturations with ambulation with pulse ox.  Advised patient of outpatient management with supportive care.  Return precautions for the ED were discussed.   [DS]    Clinical Course User Index [DS] Delton Prairie, MD     ____________________________________________   FINAL CLINICAL IMPRESSION(S) / ED DIAGNOSES  Final diagnoses:  Shortness of breath  Other chest pain  COVID-19     ED Discharge Orders    None       Mika Griffitts Katrinka Blazing   Note:  This document was prepared using Dragon voice recognition software and may include unintentional dictation errors.   Delton Prairie, MD 05/29/20 1535

## 2020-05-29 NOTE — ED Notes (Signed)
Pt ambulated around room on room air. Oxygen saturation stayed between 97-100%. No distress noted while ambulating.

## 2020-05-29 NOTE — ED Triage Notes (Signed)
Pt to ED via POV for shortness of breath x 10 days. Pt diagnosed with COVID 10 days ago. Pt states that he is having nausea x 3-4 days, not able to eat. Burning pain in chest today. Pt is in NAD.

## 2020-05-29 NOTE — Discharge Instructions (Signed)
You were seen in the ED because of your continued shortness of breath and chest pain from COVID-19.  There is no signs of pneumonia, heart attack or other serious illnesses.  Your oxygen numbers are staying normal, so you do not need extra oxygen or to stay in the hospital.  Please take both Tylenol and ibuprofen moving forward to help with your symptoms.  Push fluids as much as you can to maintain hydration.  It is okay if you do not eat very much though.  Use the below regimen.  Follow-up with your PCP. Please take Tylenol and ibuprofen/Advil for your pain.  It is safe to take them together, or to alternate them every few hours.  Take up to 1000mg  of Tylenol at a time, up to 4 times per day.  Do not take more than 4000 mg of Tylenol in 24 hours.  For ibuprofen, take 400-600 mg, 4-5 times per day.  If you develop any worsening symptoms despite the above regimen, please return to the ED.

## 2020-05-29 NOTE — ED Notes (Signed)
ED Provider at bedside. 

## 2021-08-05 IMAGING — DX DG CHEST 1V PORT
1 series · 1 of 1 positions shown · non-contrast
Comparison: 03/21/2010

CLINICAL DATA: Pt to ED via POV for shortness of breath x 10 days.
Pt diagnosed with COVID 10 days ago. Pt states that he is having
nausea x 3-4 days, not able to eat. Burning pain in chest today.
Never a smoker

EXAM:
PORTABLE CHEST 1 VIEW

[chest ap]
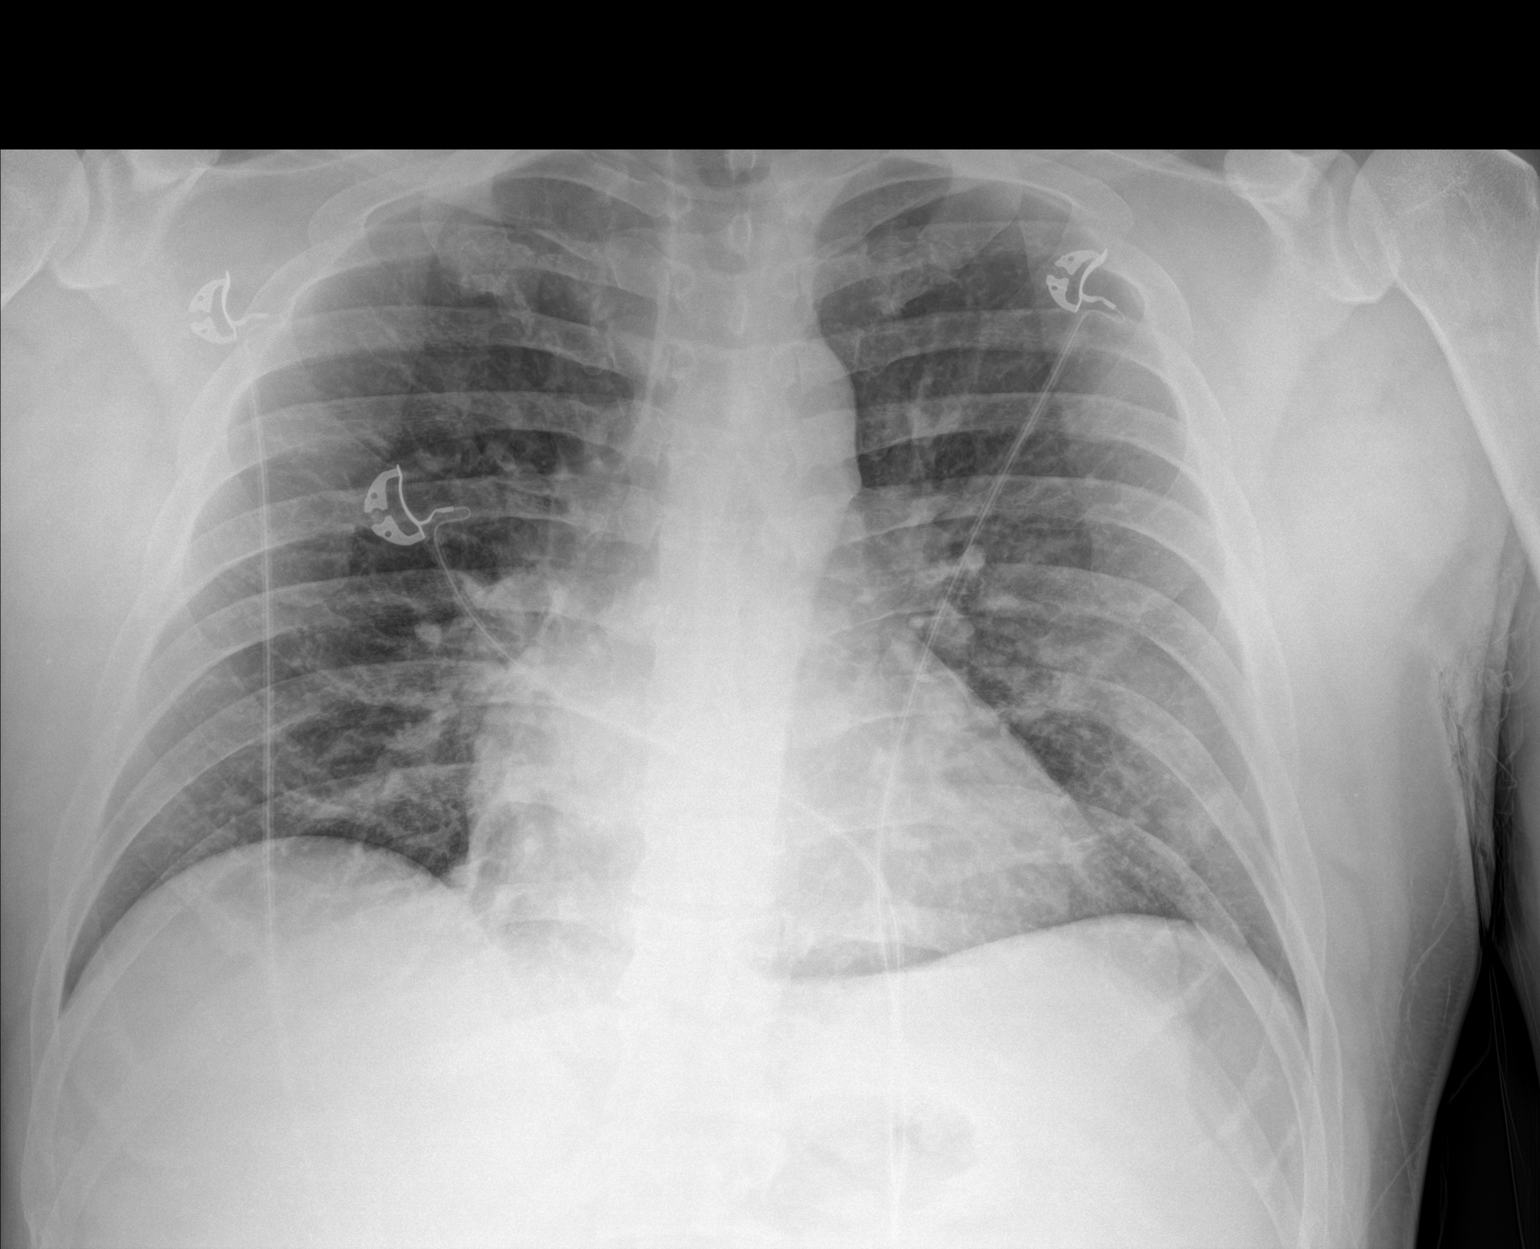

[1 of 1 positions shown; findings below may reference images not displayed]

FINDINGS: Subtle hazy airspace opacities are noted in the left mid and lower
lung. Remainder of the lungs is clear.

Cardiac silhouette is normal in size. Normal mediastinal and hilar
contours.

No pleural effusion or pneumothorax.

Skeletal structures grossly intact.
IMPRESSION: 1. Subtle hazy airspace lung opacities in the left mid to lower lung
consistent with multifocal pneumonia, pattern compatible with
[id number] infection.

## 2022-11-19 ENCOUNTER — Ambulatory Visit
Admission: EM | Admit: 2022-11-19 | Discharge: 2022-11-19 | Disposition: A | Discharge status: Home / Self Care | Payer: 59

## 2022-11-19 DIAGNOSIS — J0100 Acute maxillary sinusitis, unspecified: Secondary | ICD-10-CM | POA: Diagnosis not present

## 2022-11-19 DIAGNOSIS — H1031 Unspecified acute conjunctivitis, right eye: Secondary | ICD-10-CM | POA: Diagnosis not present

## 2022-11-19 MED ORDER — PREDNISONE 10 MG (21) PO TBPK: ORAL_TABLET | Freq: Every day | ORAL | 0 refills | Status: AC

## 2022-11-19 MED ORDER — POLYMYXIN B-TRIMETHOPRIM 10000-0.1 UNIT/ML-% OP SOLN: 1.0000 [drp] | Freq: Four times a day (QID) | OPHTHALMIC | 0 refills | Status: AC

## 2022-11-19 MED ORDER — AMOXICILLIN 875 MG PO TABS: 875.0000 mg | ORAL_TABLET | Freq: Two times a day (BID) | ORAL | 0 refills | Status: AC

## 2022-11-19 NOTE — ED Provider Notes (Signed)
Bob Liu    CSN: [id number] Arrival date & time: 11/19/22  4098      History   Chief Complaint Chief Complaint  Patient presents with   Nasal Congestion   Eye Drainage    HPI Bob Liu is a 42 y.o. male.  Patient presents with right eye redness, itching, drainage today.  No eye trauma, eye pain, change in vision.  His wife has pink eye.  He also reports sinus pressure, congestion, mild cough x 1 week.  No fever, rash, shortness of breath, vomiting, diarrhea, or other symptoms.  Treating symptoms with Mucinex.  Patient states symptoms are not improving with OTC treatment and he has a job interview out of state in 3 days.  Patient was seen at [facility] urgent care in [place] on 11/15/2022; diagnosed with sore throat and viral URI; negative for COVID, influenza, and strep; treated symptomatically.    The history is provided by the patient and medical records.    Past Medical History:  Diagnosis Date   GERD (gastroesophageal reflux disease)    Inguinal hernia 06/29/2015   Kidney stones     Patient Active Problem List   Diagnosis Date Noted   Inguinal hernia 06/29/2015    Past Surgical History:  Procedure Laterality Date   CHOLECYSTECTOMY     INGUINAL HERNIA REPAIR Bilateral 08/03/2015   Procedure: LAPAROSCOPIC INGUINAL HERNIA;  Surgeon: Lattie Haw, MD;  Location: [facility] ORS;  Service: General;  Laterality: Bilateral;       Home Medications    Prior to Admission medications   Medication Sig Start Date End Date Taking? Authorizing Provider  amoxicillin (AMOXIL) 875 MG tablet Take 1 tablet (875 mg total) by mouth 2 (two) times daily for 10 days. 11/19/22 11/29/22 Yes Mickie Bail, NP  predniSONE (STERAPRED UNI-PAK 21 TAB) 10 MG (21) TBPK tablet Take by mouth daily. As directed 11/19/22  Yes Mickie Bail, NP  trimethoprim-polymyxin b (POLYTRIM) ophthalmic solution Place 1 drop into both eyes 4 (four) times daily for 7 days. 11/19/22 11/26/22 Yes Mickie Bail, NP  fexofenadine (ALLEGRA) 180 MG tablet Take 180 mg by mouth daily. Reported on 04/19/2016    [provider]  fluticasone (FLONASE) 50 MCG/ACT nasal spray Place 2 sprays into both nostrils as needed for allergies or rhinitis. Reported on 04/19/2016    [provider]  lovastatin (MEVACOR) 40 MG tablet Take 40 mg by mouth at bedtime.    [provider]    Family History Family History  Problem Relation Age of Onset   Macular degeneration Mother    Diabetes Father    Hypertension Father    Inguinal hernia Neg Hx     Social History Social History   Tobacco Use   Smoking status: Never  Substance Use Topics   Alcohol use: Not Currently    Comment: most days   Drug use: No     Allergies   Patient has no known allergies.   Review of Systems Review of Systems  Constitutional:  Negative for chills and fever.  HENT:  Positive for congestion, postnasal drip, rhinorrhea and sinus pressure. Negative for ear pain and sore throat.   Eyes:  Positive for discharge, redness and itching. Negative for pain and visual disturbance.  Respiratory:  Positive for cough. Negative for shortness of breath.   Cardiovascular:  Negative for chest pain and palpitations.  Gastrointestinal:  Negative for diarrhea and vomiting.  Skin:  Negative for color change  and rash.  All other systems reviewed and are negative.    Physical Exam Triage Vital Signs ED Triage Vitals  Enc Vitals Group     BP      Pulse      Resp      Temp      Temp src      SpO2      Weight      Height      Head Circumference      Peak Flow      Pain Score      Pain Loc      Pain Edu?      Excl. in [place]?    No data found.  Updated Vital Signs BP (!) 123/91   Pulse 85   Temp 98.9 F (37.2 C)   Resp 18   Ht 6' (1.829 m)   Wt 245 lb (111.1 kg)   SpO2 98%   BMI 33.23 kg/m   Visual Acuity Right Eye Distance:   Left Eye Distance:   Bilateral Distance:    Right Eye Near:    Left Eye Near:    Bilateral Near:     Physical Exam Vitals and nursing note reviewed.  Constitutional:      General: He is not in acute distress.    Appearance: Normal appearance. He is well-developed. He is not ill-appearing.  HENT:     Right Ear: Tympanic membrane normal.     Left Ear: Tympanic membrane normal.     Nose: Congestion and rhinorrhea present.     Mouth/Throat:     Mouth: Mucous membranes are moist.     Pharynx: Oropharynx is clear.  Eyes:     General: Lids are normal. Vision grossly intact.     Extraocular Movements: Extraocular movements intact.     Conjunctiva/sclera:     Right eye: Right conjunctiva is injected.     Pupils: Pupils are equal, round, and reactive to light.  Cardiovascular:     Rate and Rhythm: Normal rate and regular rhythm.     Heart sounds: Normal heart sounds.  Pulmonary:     Effort: Pulmonary effort is normal. No respiratory distress.     Breath sounds: Normal breath sounds.  Musculoskeletal:     Cervical back: Neck supple.  Skin:    General: Skin is warm and dry.  Neurological:     Mental Status: He is alert.  Psychiatric:        Mood and Affect: Mood normal.        Behavior: Behavior normal.      UC Treatments / Results  Labs (all labs ordered are listed, but only abnormal results are displayed) Labs Reviewed - No data to display  EKG   Radiology No results found.  Procedures Procedures (including critical care time)  Medications Ordered in UC Medications - No data to display  Initial Impression / Assessment and Plan / UC Course  I have reviewed the triage vital signs and the nursing notes.  Pertinent labs & imaging results that were available during my care of the patient were reviewed by me and considered in my medical decision making (see chart for details).    Acute sinusitis, conjunctivitis.  Treating with amoxicillin and prednisone.  Also treating with Polytrim eyedrops.  Education provided on sinusitis and  conjunctivitis.  Instructed patient to follow-up with his PCP if his symptoms are not improving.  ED precautions given.  Patient agrees to plan of care.  Final Clinical Impressions(s) / UC Diagnoses   Final diagnoses:  Acute non-recurrent maxillary sinusitis  Acute bacterial conjunctivitis of right eye     Discharge Instructions      Take the amoxicillin and prednisone as directed.    Use the antibiotic eyedrops as prescribed.    Follow-up with your primary care provider if your symptoms are not improving.        ED Prescriptions     Medication Sig Dispense Auth. Provider   trimethoprim-polymyxin b (POLYTRIM) ophthalmic solution Place 1 drop into both eyes 4 (four) times daily for 7 days. 10 mL Sharion Balloon, NP   amoxicillin (AMOXIL) 875 MG tablet Take 1 tablet (875 mg total) by mouth 2 (two) times daily for 10 days. 20 tablet Sharion Balloon, NP   predniSONE (STERAPRED UNI-PAK 21 TAB) 10 MG (21) TBPK tablet Take by mouth daily. As directed 21 tablet Sharion Balloon, NP      PDMP not reviewed this encounter.   Sharion Balloon, NP 11/19/22 1109

## 2022-11-19 NOTE — Discharge Instructions (Addendum)
Take the amoxicillin and prednisone as directed.    Use the antibiotic eyedrops as prescribed.    Follow-up with your primary care provider if your symptoms are not improving.

## 2022-11-19 NOTE — ED Triage Notes (Addendum)
Patient to Urgent Care with complaints of right sided eye drainage/ redness/ and itching. Reports waking up this morning with symptoms. Wife currently has pink eye.  Also presents with complaints of sinus pain/ teeth pain, sinus headache this morning. Reports he has been suffering with a cold x1 week. Last fever Thursday of 100.1, was seen in an urgent care in [place] on Thursday and tested negative for covid/ flu/ strep.  Has been taking otc mucinex max.

## 2023-01-01 ENCOUNTER — Other Ambulatory Visit: Payer: Self-pay | Admitting: Family Medicine

## 2023-01-01 DIAGNOSIS — E7800 Pure hypercholesterolemia, unspecified: Secondary | ICD-10-CM

## 2023-01-01 DIAGNOSIS — E6609 Other obesity due to excess calories: Secondary | ICD-10-CM

## 2023-01-01 DIAGNOSIS — Z8249 Family history of ischemic heart disease and other diseases of the circulatory system: Secondary | ICD-10-CM

## 2023-01-15 ENCOUNTER — Ambulatory Visit
Admission: RE | Admit: 2023-01-15 | Discharge: 2023-01-15 | Disposition: A | Discharge status: Home / Self Care | Payer: 59 | Source: Ambulatory Visit | Attending: Family Medicine | Admitting: Family Medicine

## 2023-01-15 DIAGNOSIS — E6609 Other obesity due to excess calories: Secondary | ICD-10-CM

## 2023-01-15 DIAGNOSIS — E7800 Pure hypercholesterolemia, unspecified: Secondary | ICD-10-CM | POA: Insufficient documentation

## 2023-01-15 DIAGNOSIS — Z8249 Family history of ischemic heart disease and other diseases of the circulatory system: Secondary | ICD-10-CM

## 2023-01-15 DIAGNOSIS — Z6833 Body mass index (BMI) 33.0-33.9, adult: Secondary | ICD-10-CM | POA: Insufficient documentation
